# Patient Record
Sex: Female | Born: 1975
Health system: Southern US, Community
[De-identification: ages and names within clinical notes are randomized; demographics above are authoritative.]

## PROBLEM LIST (undated history)

## (undated) DIAGNOSIS — K909 Intestinal malabsorption, unspecified: Secondary | ICD-10-CM

## (undated) DIAGNOSIS — F419 Anxiety disorder, unspecified: Secondary | ICD-10-CM

## (undated) DIAGNOSIS — R251 Tremor, unspecified: Secondary | ICD-10-CM

## (undated) DIAGNOSIS — R61 Generalized hyperhidrosis: Secondary | ICD-10-CM

## (undated) DIAGNOSIS — F32A Depression, unspecified: Secondary | ICD-10-CM

## (undated) DIAGNOSIS — G43909 Migraine, unspecified, not intractable, without status migrainosus: Secondary | ICD-10-CM

## (undated) HISTORY — DX: Migraine, unspecified, not intractable, without status migrainosus: G43.909

## (undated) HISTORY — PX: CHOLECYSTECTOMY: SHX55

## (undated) HISTORY — DX: Anxiety disorder, unspecified: F41.9

## (undated) HISTORY — DX: Intestinal malabsorption, unspecified: K90.9

## (undated) HISTORY — DX: Generalized hyperhidrosis: R61

## (undated) HISTORY — DX: Depression, unspecified: F32.A

## (undated) HISTORY — DX: Tremor, unspecified: R25.1

## (undated) HISTORY — PX: LEEP: SHX91

---

## 2002-05-15 DIAGNOSIS — K9089 Other intestinal malabsorption: Secondary | ICD-10-CM | POA: Insufficient documentation

## 2016-07-05 DIAGNOSIS — M9905 Segmental and somatic dysfunction of pelvic region: Secondary | ICD-10-CM | POA: Diagnosis not present

## 2016-07-05 DIAGNOSIS — R51 Headache: Secondary | ICD-10-CM | POA: Diagnosis not present

## 2016-07-05 DIAGNOSIS — M9903 Segmental and somatic dysfunction of lumbar region: Secondary | ICD-10-CM | POA: Diagnosis not present

## 2016-07-05 DIAGNOSIS — M9901 Segmental and somatic dysfunction of cervical region: Secondary | ICD-10-CM | POA: Diagnosis not present

## 2016-07-11 DIAGNOSIS — M9901 Segmental and somatic dysfunction of cervical region: Secondary | ICD-10-CM | POA: Diagnosis not present

## 2016-07-11 DIAGNOSIS — R51 Headache: Secondary | ICD-10-CM | POA: Diagnosis not present

## 2016-07-11 DIAGNOSIS — M9905 Segmental and somatic dysfunction of pelvic region: Secondary | ICD-10-CM | POA: Diagnosis not present

## 2016-07-11 DIAGNOSIS — M9903 Segmental and somatic dysfunction of lumbar region: Secondary | ICD-10-CM | POA: Diagnosis not present

## 2016-07-18 DIAGNOSIS — M9901 Segmental and somatic dysfunction of cervical region: Secondary | ICD-10-CM | POA: Diagnosis not present

## 2016-07-18 DIAGNOSIS — M9905 Segmental and somatic dysfunction of pelvic region: Secondary | ICD-10-CM | POA: Diagnosis not present

## 2016-07-18 DIAGNOSIS — R51 Headache: Secondary | ICD-10-CM | POA: Diagnosis not present

## 2016-07-18 DIAGNOSIS — M9903 Segmental and somatic dysfunction of lumbar region: Secondary | ICD-10-CM | POA: Diagnosis not present

## 2016-08-01 DIAGNOSIS — R51 Headache: Secondary | ICD-10-CM | POA: Diagnosis not present

## 2016-08-01 DIAGNOSIS — M9905 Segmental and somatic dysfunction of pelvic region: Secondary | ICD-10-CM | POA: Diagnosis not present

## 2016-08-01 DIAGNOSIS — M9901 Segmental and somatic dysfunction of cervical region: Secondary | ICD-10-CM | POA: Diagnosis not present

## 2016-08-01 DIAGNOSIS — M9903 Segmental and somatic dysfunction of lumbar region: Secondary | ICD-10-CM | POA: Diagnosis not present

## 2016-08-18 DIAGNOSIS — M9901 Segmental and somatic dysfunction of cervical region: Secondary | ICD-10-CM | POA: Diagnosis not present

## 2016-08-18 DIAGNOSIS — M9903 Segmental and somatic dysfunction of lumbar region: Secondary | ICD-10-CM | POA: Diagnosis not present

## 2016-08-18 DIAGNOSIS — R51 Headache: Secondary | ICD-10-CM | POA: Diagnosis not present

## 2016-08-18 DIAGNOSIS — M9905 Segmental and somatic dysfunction of pelvic region: Secondary | ICD-10-CM | POA: Diagnosis not present

## 2016-08-23 DIAGNOSIS — K9089 Other intestinal malabsorption: Secondary | ICD-10-CM | POA: Diagnosis not present

## 2016-08-23 DIAGNOSIS — R109 Unspecified abdominal pain: Secondary | ICD-10-CM | POA: Diagnosis not present

## 2016-09-01 DIAGNOSIS — M9901 Segmental and somatic dysfunction of cervical region: Secondary | ICD-10-CM | POA: Diagnosis not present

## 2016-09-01 DIAGNOSIS — M9905 Segmental and somatic dysfunction of pelvic region: Secondary | ICD-10-CM | POA: Diagnosis not present

## 2016-09-01 DIAGNOSIS — R51 Headache: Secondary | ICD-10-CM | POA: Diagnosis not present

## 2016-09-01 DIAGNOSIS — M9903 Segmental and somatic dysfunction of lumbar region: Secondary | ICD-10-CM | POA: Diagnosis not present

## 2016-09-22 DIAGNOSIS — K9049 Malabsorption due to intolerance, not elsewhere classified: Secondary | ICD-10-CM | POA: Diagnosis not present

## 2016-09-29 DIAGNOSIS — K9049 Malabsorption due to intolerance, not elsewhere classified: Secondary | ICD-10-CM | POA: Diagnosis not present

## 2016-10-02 DIAGNOSIS — K9049 Malabsorption due to intolerance, not elsewhere classified: Secondary | ICD-10-CM | POA: Diagnosis not present

## 2016-10-04 DIAGNOSIS — M9903 Segmental and somatic dysfunction of lumbar region: Secondary | ICD-10-CM | POA: Diagnosis not present

## 2016-10-04 DIAGNOSIS — M9901 Segmental and somatic dysfunction of cervical region: Secondary | ICD-10-CM | POA: Diagnosis not present

## 2016-10-04 DIAGNOSIS — M9905 Segmental and somatic dysfunction of pelvic region: Secondary | ICD-10-CM | POA: Diagnosis not present

## 2016-10-04 DIAGNOSIS — R51 Headache: Secondary | ICD-10-CM | POA: Diagnosis not present

## 2016-10-13 DIAGNOSIS — K580 Irritable bowel syndrome with diarrhea: Secondary | ICD-10-CM | POA: Diagnosis not present

## 2016-11-27 DIAGNOSIS — M9903 Segmental and somatic dysfunction of lumbar region: Secondary | ICD-10-CM | POA: Diagnosis not present

## 2016-11-27 DIAGNOSIS — M9905 Segmental and somatic dysfunction of pelvic region: Secondary | ICD-10-CM | POA: Diagnosis not present

## 2016-11-27 DIAGNOSIS — R51 Headache: Secondary | ICD-10-CM | POA: Diagnosis not present

## 2016-11-27 DIAGNOSIS — M9901 Segmental and somatic dysfunction of cervical region: Secondary | ICD-10-CM | POA: Diagnosis not present

## 2017-01-10 DIAGNOSIS — M9905 Segmental and somatic dysfunction of pelvic region: Secondary | ICD-10-CM | POA: Diagnosis not present

## 2017-01-10 DIAGNOSIS — M9903 Segmental and somatic dysfunction of lumbar region: Secondary | ICD-10-CM | POA: Diagnosis not present

## 2017-01-10 DIAGNOSIS — M9901 Segmental and somatic dysfunction of cervical region: Secondary | ICD-10-CM | POA: Diagnosis not present

## 2017-01-10 DIAGNOSIS — R51 Headache: Secondary | ICD-10-CM | POA: Diagnosis not present

## 2017-02-01 DIAGNOSIS — M9901 Segmental and somatic dysfunction of cervical region: Secondary | ICD-10-CM | POA: Diagnosis not present

## 2017-02-01 DIAGNOSIS — M9903 Segmental and somatic dysfunction of lumbar region: Secondary | ICD-10-CM | POA: Diagnosis not present

## 2017-02-01 DIAGNOSIS — M9905 Segmental and somatic dysfunction of pelvic region: Secondary | ICD-10-CM | POA: Diagnosis not present

## 2017-02-01 DIAGNOSIS — R51 Headache: Secondary | ICD-10-CM | POA: Diagnosis not present

## 2017-02-28 DIAGNOSIS — M9903 Segmental and somatic dysfunction of lumbar region: Secondary | ICD-10-CM | POA: Diagnosis not present

## 2017-02-28 DIAGNOSIS — R51 Headache: Secondary | ICD-10-CM | POA: Diagnosis not present

## 2017-02-28 DIAGNOSIS — M9905 Segmental and somatic dysfunction of pelvic region: Secondary | ICD-10-CM | POA: Diagnosis not present

## 2017-02-28 DIAGNOSIS — M9901 Segmental and somatic dysfunction of cervical region: Secondary | ICD-10-CM | POA: Diagnosis not present

## 2017-03-28 DIAGNOSIS — R51 Headache: Secondary | ICD-10-CM | POA: Diagnosis not present

## 2017-03-28 DIAGNOSIS — M9905 Segmental and somatic dysfunction of pelvic region: Secondary | ICD-10-CM | POA: Diagnosis not present

## 2017-03-28 DIAGNOSIS — M9903 Segmental and somatic dysfunction of lumbar region: Secondary | ICD-10-CM | POA: Diagnosis not present

## 2017-03-28 DIAGNOSIS — M9901 Segmental and somatic dysfunction of cervical region: Secondary | ICD-10-CM | POA: Diagnosis not present

## 2017-04-25 DIAGNOSIS — M9905 Segmental and somatic dysfunction of pelvic region: Secondary | ICD-10-CM | POA: Diagnosis not present

## 2017-04-25 DIAGNOSIS — M9903 Segmental and somatic dysfunction of lumbar region: Secondary | ICD-10-CM | POA: Diagnosis not present

## 2017-04-25 DIAGNOSIS — M9901 Segmental and somatic dysfunction of cervical region: Secondary | ICD-10-CM | POA: Diagnosis not present

## 2017-04-25 DIAGNOSIS — R51 Headache: Secondary | ICD-10-CM | POA: Diagnosis not present

## 2017-04-25 DIAGNOSIS — L74519 Primary focal hyperhidrosis, unspecified: Secondary | ICD-10-CM | POA: Diagnosis not present

## 2017-04-25 DIAGNOSIS — K9089 Other intestinal malabsorption: Secondary | ICD-10-CM | POA: Diagnosis not present

## 2017-05-22 DIAGNOSIS — K580 Irritable bowel syndrome with diarrhea: Secondary | ICD-10-CM | POA: Diagnosis not present

## 2017-05-23 DIAGNOSIS — R51 Headache: Secondary | ICD-10-CM | POA: Diagnosis not present

## 2017-05-23 DIAGNOSIS — M9903 Segmental and somatic dysfunction of lumbar region: Secondary | ICD-10-CM | POA: Diagnosis not present

## 2017-05-23 DIAGNOSIS — M9901 Segmental and somatic dysfunction of cervical region: Secondary | ICD-10-CM | POA: Diagnosis not present

## 2017-05-23 DIAGNOSIS — M9905 Segmental and somatic dysfunction of pelvic region: Secondary | ICD-10-CM | POA: Diagnosis not present

## 2017-06-20 DIAGNOSIS — M9905 Segmental and somatic dysfunction of pelvic region: Secondary | ICD-10-CM | POA: Diagnosis not present

## 2017-06-20 DIAGNOSIS — M9903 Segmental and somatic dysfunction of lumbar region: Secondary | ICD-10-CM | POA: Diagnosis not present

## 2017-06-20 DIAGNOSIS — R51 Headache: Secondary | ICD-10-CM | POA: Diagnosis not present

## 2017-06-20 DIAGNOSIS — M9901 Segmental and somatic dysfunction of cervical region: Secondary | ICD-10-CM | POA: Diagnosis not present

## 2017-07-18 DIAGNOSIS — M9905 Segmental and somatic dysfunction of pelvic region: Secondary | ICD-10-CM | POA: Diagnosis not present

## 2017-07-18 DIAGNOSIS — R51 Headache: Secondary | ICD-10-CM | POA: Diagnosis not present

## 2017-07-18 DIAGNOSIS — M9901 Segmental and somatic dysfunction of cervical region: Secondary | ICD-10-CM | POA: Diagnosis not present

## 2017-07-18 DIAGNOSIS — M9903 Segmental and somatic dysfunction of lumbar region: Secondary | ICD-10-CM | POA: Diagnosis not present

## 2017-08-15 DIAGNOSIS — M9901 Segmental and somatic dysfunction of cervical region: Secondary | ICD-10-CM | POA: Diagnosis not present

## 2017-08-15 DIAGNOSIS — R51 Headache: Secondary | ICD-10-CM | POA: Diagnosis not present

## 2017-08-15 DIAGNOSIS — M9905 Segmental and somatic dysfunction of pelvic region: Secondary | ICD-10-CM | POA: Diagnosis not present

## 2017-08-15 DIAGNOSIS — M9903 Segmental and somatic dysfunction of lumbar region: Secondary | ICD-10-CM | POA: Diagnosis not present

## 2017-09-11 DIAGNOSIS — K580 Irritable bowel syndrome with diarrhea: Secondary | ICD-10-CM | POA: Diagnosis not present

## 2017-09-12 DIAGNOSIS — M9905 Segmental and somatic dysfunction of pelvic region: Secondary | ICD-10-CM | POA: Diagnosis not present

## 2017-09-12 DIAGNOSIS — M9903 Segmental and somatic dysfunction of lumbar region: Secondary | ICD-10-CM | POA: Diagnosis not present

## 2017-09-12 DIAGNOSIS — R51 Headache: Secondary | ICD-10-CM | POA: Diagnosis not present

## 2017-09-12 DIAGNOSIS — M9901 Segmental and somatic dysfunction of cervical region: Secondary | ICD-10-CM | POA: Diagnosis not present

## 2017-11-07 DIAGNOSIS — M9905 Segmental and somatic dysfunction of pelvic region: Secondary | ICD-10-CM | POA: Diagnosis not present

## 2017-11-07 DIAGNOSIS — M9901 Segmental and somatic dysfunction of cervical region: Secondary | ICD-10-CM | POA: Diagnosis not present

## 2017-11-07 DIAGNOSIS — M9903 Segmental and somatic dysfunction of lumbar region: Secondary | ICD-10-CM | POA: Diagnosis not present

## 2017-11-07 DIAGNOSIS — R51 Headache: Secondary | ICD-10-CM | POA: Diagnosis not present

## 2017-12-05 DIAGNOSIS — M9903 Segmental and somatic dysfunction of lumbar region: Secondary | ICD-10-CM | POA: Diagnosis not present

## 2017-12-05 DIAGNOSIS — M9901 Segmental and somatic dysfunction of cervical region: Secondary | ICD-10-CM | POA: Diagnosis not present

## 2017-12-05 DIAGNOSIS — M9905 Segmental and somatic dysfunction of pelvic region: Secondary | ICD-10-CM | POA: Diagnosis not present

## 2017-12-05 DIAGNOSIS — R51 Headache: Secondary | ICD-10-CM | POA: Diagnosis not present

## 2018-01-02 DIAGNOSIS — M9902 Segmental and somatic dysfunction of thoracic region: Secondary | ICD-10-CM | POA: Diagnosis not present

## 2018-01-02 DIAGNOSIS — M9901 Segmental and somatic dysfunction of cervical region: Secondary | ICD-10-CM | POA: Diagnosis not present

## 2018-01-02 DIAGNOSIS — M9905 Segmental and somatic dysfunction of pelvic region: Secondary | ICD-10-CM | POA: Diagnosis not present

## 2018-01-02 DIAGNOSIS — M9903 Segmental and somatic dysfunction of lumbar region: Secondary | ICD-10-CM | POA: Diagnosis not present

## 2018-01-30 DIAGNOSIS — M9902 Segmental and somatic dysfunction of thoracic region: Secondary | ICD-10-CM | POA: Diagnosis not present

## 2018-01-30 DIAGNOSIS — M9901 Segmental and somatic dysfunction of cervical region: Secondary | ICD-10-CM | POA: Diagnosis not present

## 2018-01-30 DIAGNOSIS — M9905 Segmental and somatic dysfunction of pelvic region: Secondary | ICD-10-CM | POA: Diagnosis not present

## 2018-01-30 DIAGNOSIS — M9903 Segmental and somatic dysfunction of lumbar region: Secondary | ICD-10-CM | POA: Diagnosis not present

## 2018-02-28 DIAGNOSIS — M9903 Segmental and somatic dysfunction of lumbar region: Secondary | ICD-10-CM | POA: Diagnosis not present

## 2018-02-28 DIAGNOSIS — M9901 Segmental and somatic dysfunction of cervical region: Secondary | ICD-10-CM | POA: Diagnosis not present

## 2018-02-28 DIAGNOSIS — M9902 Segmental and somatic dysfunction of thoracic region: Secondary | ICD-10-CM | POA: Diagnosis not present

## 2018-02-28 DIAGNOSIS — M9905 Segmental and somatic dysfunction of pelvic region: Secondary | ICD-10-CM | POA: Diagnosis not present

## 2018-04-15 DIAGNOSIS — M9903 Segmental and somatic dysfunction of lumbar region: Secondary | ICD-10-CM | POA: Diagnosis not present

## 2018-04-15 DIAGNOSIS — M9902 Segmental and somatic dysfunction of thoracic region: Secondary | ICD-10-CM | POA: Diagnosis not present

## 2018-04-15 DIAGNOSIS — M9905 Segmental and somatic dysfunction of pelvic region: Secondary | ICD-10-CM | POA: Diagnosis not present

## 2018-04-15 DIAGNOSIS — M9901 Segmental and somatic dysfunction of cervical region: Secondary | ICD-10-CM | POA: Diagnosis not present

## 2018-05-03 DIAGNOSIS — L74519 Primary focal hyperhidrosis, unspecified: Secondary | ICD-10-CM | POA: Diagnosis not present

## 2018-05-03 DIAGNOSIS — K9089 Other intestinal malabsorption: Secondary | ICD-10-CM | POA: Diagnosis not present

## 2018-05-17 DIAGNOSIS — M9903 Segmental and somatic dysfunction of lumbar region: Secondary | ICD-10-CM | POA: Diagnosis not present

## 2018-05-17 DIAGNOSIS — M9901 Segmental and somatic dysfunction of cervical region: Secondary | ICD-10-CM | POA: Diagnosis not present

## 2018-05-17 DIAGNOSIS — M9905 Segmental and somatic dysfunction of pelvic region: Secondary | ICD-10-CM | POA: Diagnosis not present

## 2018-05-17 DIAGNOSIS — M9902 Segmental and somatic dysfunction of thoracic region: Secondary | ICD-10-CM | POA: Diagnosis not present

## 2018-06-21 DIAGNOSIS — M9905 Segmental and somatic dysfunction of pelvic region: Secondary | ICD-10-CM | POA: Diagnosis not present

## 2018-06-21 DIAGNOSIS — M9903 Segmental and somatic dysfunction of lumbar region: Secondary | ICD-10-CM | POA: Diagnosis not present

## 2018-06-21 DIAGNOSIS — M9901 Segmental and somatic dysfunction of cervical region: Secondary | ICD-10-CM | POA: Diagnosis not present

## 2018-06-21 DIAGNOSIS — M9902 Segmental and somatic dysfunction of thoracic region: Secondary | ICD-10-CM | POA: Diagnosis not present

## 2018-07-19 DIAGNOSIS — M9901 Segmental and somatic dysfunction of cervical region: Secondary | ICD-10-CM | POA: Diagnosis not present

## 2018-07-19 DIAGNOSIS — M9903 Segmental and somatic dysfunction of lumbar region: Secondary | ICD-10-CM | POA: Diagnosis not present

## 2018-07-19 DIAGNOSIS — M9902 Segmental and somatic dysfunction of thoracic region: Secondary | ICD-10-CM | POA: Diagnosis not present

## 2018-07-19 DIAGNOSIS — M9905 Segmental and somatic dysfunction of pelvic region: Secondary | ICD-10-CM | POA: Diagnosis not present

## 2018-10-31 DIAGNOSIS — M9905 Segmental and somatic dysfunction of pelvic region: Secondary | ICD-10-CM | POA: Diagnosis not present

## 2018-10-31 DIAGNOSIS — M9903 Segmental and somatic dysfunction of lumbar region: Secondary | ICD-10-CM | POA: Diagnosis not present

## 2018-10-31 DIAGNOSIS — M9902 Segmental and somatic dysfunction of thoracic region: Secondary | ICD-10-CM | POA: Diagnosis not present

## 2018-10-31 DIAGNOSIS — M9901 Segmental and somatic dysfunction of cervical region: Secondary | ICD-10-CM | POA: Diagnosis not present

## 2019-01-01 DIAGNOSIS — M9903 Segmental and somatic dysfunction of lumbar region: Secondary | ICD-10-CM | POA: Diagnosis not present

## 2019-01-01 DIAGNOSIS — M9905 Segmental and somatic dysfunction of pelvic region: Secondary | ICD-10-CM | POA: Diagnosis not present

## 2019-01-01 DIAGNOSIS — M9901 Segmental and somatic dysfunction of cervical region: Secondary | ICD-10-CM | POA: Diagnosis not present

## 2019-01-01 DIAGNOSIS — M9902 Segmental and somatic dysfunction of thoracic region: Secondary | ICD-10-CM | POA: Diagnosis not present

## 2019-01-08 DIAGNOSIS — M9901 Segmental and somatic dysfunction of cervical region: Secondary | ICD-10-CM | POA: Diagnosis not present

## 2019-01-08 DIAGNOSIS — M9902 Segmental and somatic dysfunction of thoracic region: Secondary | ICD-10-CM | POA: Diagnosis not present

## 2019-01-08 DIAGNOSIS — M9903 Segmental and somatic dysfunction of lumbar region: Secondary | ICD-10-CM | POA: Diagnosis not present

## 2019-01-08 DIAGNOSIS — M9905 Segmental and somatic dysfunction of pelvic region: Secondary | ICD-10-CM | POA: Diagnosis not present

## 2019-01-22 DIAGNOSIS — M9905 Segmental and somatic dysfunction of pelvic region: Secondary | ICD-10-CM | POA: Diagnosis not present

## 2019-01-22 DIAGNOSIS — M9901 Segmental and somatic dysfunction of cervical region: Secondary | ICD-10-CM | POA: Diagnosis not present

## 2019-01-22 DIAGNOSIS — M9902 Segmental and somatic dysfunction of thoracic region: Secondary | ICD-10-CM | POA: Diagnosis not present

## 2019-01-22 DIAGNOSIS — M9903 Segmental and somatic dysfunction of lumbar region: Secondary | ICD-10-CM | POA: Diagnosis not present

## 2019-02-06 DIAGNOSIS — M9902 Segmental and somatic dysfunction of thoracic region: Secondary | ICD-10-CM | POA: Diagnosis not present

## 2019-02-06 DIAGNOSIS — M9901 Segmental and somatic dysfunction of cervical region: Secondary | ICD-10-CM | POA: Diagnosis not present

## 2019-02-06 DIAGNOSIS — M9905 Segmental and somatic dysfunction of pelvic region: Secondary | ICD-10-CM | POA: Diagnosis not present

## 2019-02-06 DIAGNOSIS — M9903 Segmental and somatic dysfunction of lumbar region: Secondary | ICD-10-CM | POA: Diagnosis not present

## 2019-02-27 DIAGNOSIS — M9901 Segmental and somatic dysfunction of cervical region: Secondary | ICD-10-CM | POA: Diagnosis not present

## 2019-02-27 DIAGNOSIS — M9905 Segmental and somatic dysfunction of pelvic region: Secondary | ICD-10-CM | POA: Diagnosis not present

## 2019-02-27 DIAGNOSIS — M9902 Segmental and somatic dysfunction of thoracic region: Secondary | ICD-10-CM | POA: Diagnosis not present

## 2019-02-27 DIAGNOSIS — M9903 Segmental and somatic dysfunction of lumbar region: Secondary | ICD-10-CM | POA: Diagnosis not present

## 2019-03-26 DIAGNOSIS — M9902 Segmental and somatic dysfunction of thoracic region: Secondary | ICD-10-CM | POA: Diagnosis not present

## 2019-03-26 DIAGNOSIS — M9903 Segmental and somatic dysfunction of lumbar region: Secondary | ICD-10-CM | POA: Diagnosis not present

## 2019-03-26 DIAGNOSIS — M9905 Segmental and somatic dysfunction of pelvic region: Secondary | ICD-10-CM | POA: Diagnosis not present

## 2019-03-26 DIAGNOSIS — M9901 Segmental and somatic dysfunction of cervical region: Secondary | ICD-10-CM | POA: Diagnosis not present

## 2019-04-29 DIAGNOSIS — M9901 Segmental and somatic dysfunction of cervical region: Secondary | ICD-10-CM | POA: Diagnosis not present

## 2019-04-29 DIAGNOSIS — M9903 Segmental and somatic dysfunction of lumbar region: Secondary | ICD-10-CM | POA: Diagnosis not present

## 2019-04-29 DIAGNOSIS — M9902 Segmental and somatic dysfunction of thoracic region: Secondary | ICD-10-CM | POA: Diagnosis not present

## 2019-04-29 DIAGNOSIS — M9905 Segmental and somatic dysfunction of pelvic region: Secondary | ICD-10-CM | POA: Diagnosis not present

## 2019-05-02 DIAGNOSIS — L74519 Primary focal hyperhidrosis, unspecified: Secondary | ICD-10-CM | POA: Diagnosis not present

## 2019-05-02 DIAGNOSIS — K9089 Other intestinal malabsorption: Secondary | ICD-10-CM | POA: Diagnosis not present

## 2019-05-27 DIAGNOSIS — M9903 Segmental and somatic dysfunction of lumbar region: Secondary | ICD-10-CM | POA: Diagnosis not present

## 2019-05-27 DIAGNOSIS — M9902 Segmental and somatic dysfunction of thoracic region: Secondary | ICD-10-CM | POA: Diagnosis not present

## 2019-05-27 DIAGNOSIS — M9901 Segmental and somatic dysfunction of cervical region: Secondary | ICD-10-CM | POA: Diagnosis not present

## 2019-05-27 DIAGNOSIS — M9905 Segmental and somatic dysfunction of pelvic region: Secondary | ICD-10-CM | POA: Diagnosis not present

## 2019-06-24 DIAGNOSIS — Z1151 Encounter for screening for human papillomavirus (HPV): Secondary | ICD-10-CM | POA: Diagnosis not present

## 2019-06-24 DIAGNOSIS — Z6824 Body mass index (BMI) 24.0-24.9, adult: Secondary | ICD-10-CM | POA: Diagnosis not present

## 2019-06-24 DIAGNOSIS — M9905 Segmental and somatic dysfunction of pelvic region: Secondary | ICD-10-CM | POA: Diagnosis not present

## 2019-06-24 DIAGNOSIS — M9903 Segmental and somatic dysfunction of lumbar region: Secondary | ICD-10-CM | POA: Diagnosis not present

## 2019-06-24 DIAGNOSIS — Z1231 Encounter for screening mammogram for malignant neoplasm of breast: Secondary | ICD-10-CM | POA: Diagnosis not present

## 2019-06-24 DIAGNOSIS — M9902 Segmental and somatic dysfunction of thoracic region: Secondary | ICD-10-CM | POA: Diagnosis not present

## 2019-06-24 DIAGNOSIS — Z01419 Encounter for gynecological examination (general) (routine) without abnormal findings: Secondary | ICD-10-CM | POA: Diagnosis not present

## 2019-06-24 DIAGNOSIS — M9901 Segmental and somatic dysfunction of cervical region: Secondary | ICD-10-CM | POA: Diagnosis not present

## 2019-06-24 DIAGNOSIS — Z124 Encounter for screening for malignant neoplasm of cervix: Secondary | ICD-10-CM | POA: Diagnosis not present

## 2019-07-29 DIAGNOSIS — M9903 Segmental and somatic dysfunction of lumbar region: Secondary | ICD-10-CM | POA: Diagnosis not present

## 2019-07-29 DIAGNOSIS — M9901 Segmental and somatic dysfunction of cervical region: Secondary | ICD-10-CM | POA: Diagnosis not present

## 2019-07-29 DIAGNOSIS — M9902 Segmental and somatic dysfunction of thoracic region: Secondary | ICD-10-CM | POA: Diagnosis not present

## 2019-07-29 DIAGNOSIS — M9905 Segmental and somatic dysfunction of pelvic region: Secondary | ICD-10-CM | POA: Diagnosis not present

## 2019-08-13 DIAGNOSIS — N939 Abnormal uterine and vaginal bleeding, unspecified: Secondary | ICD-10-CM | POA: Diagnosis not present

## 2019-08-26 DIAGNOSIS — M9905 Segmental and somatic dysfunction of pelvic region: Secondary | ICD-10-CM | POA: Diagnosis not present

## 2019-08-26 DIAGNOSIS — M9903 Segmental and somatic dysfunction of lumbar region: Secondary | ICD-10-CM | POA: Diagnosis not present

## 2019-08-26 DIAGNOSIS — M9901 Segmental and somatic dysfunction of cervical region: Secondary | ICD-10-CM | POA: Diagnosis not present

## 2019-08-26 DIAGNOSIS — M9902 Segmental and somatic dysfunction of thoracic region: Secondary | ICD-10-CM | POA: Diagnosis not present

## 2019-09-23 DIAGNOSIS — M9903 Segmental and somatic dysfunction of lumbar region: Secondary | ICD-10-CM | POA: Diagnosis not present

## 2019-09-23 DIAGNOSIS — M9905 Segmental and somatic dysfunction of pelvic region: Secondary | ICD-10-CM | POA: Diagnosis not present

## 2019-09-23 DIAGNOSIS — M9901 Segmental and somatic dysfunction of cervical region: Secondary | ICD-10-CM | POA: Diagnosis not present

## 2019-09-23 DIAGNOSIS — M9902 Segmental and somatic dysfunction of thoracic region: Secondary | ICD-10-CM | POA: Diagnosis not present

## 2019-11-24 DIAGNOSIS — M9905 Segmental and somatic dysfunction of pelvic region: Secondary | ICD-10-CM | POA: Diagnosis not present

## 2019-11-24 DIAGNOSIS — M9901 Segmental and somatic dysfunction of cervical region: Secondary | ICD-10-CM | POA: Diagnosis not present

## 2019-11-24 DIAGNOSIS — M9903 Segmental and somatic dysfunction of lumbar region: Secondary | ICD-10-CM | POA: Diagnosis not present

## 2019-11-24 DIAGNOSIS — M9902 Segmental and somatic dysfunction of thoracic region: Secondary | ICD-10-CM | POA: Diagnosis not present

## 2019-12-30 DIAGNOSIS — M9903 Segmental and somatic dysfunction of lumbar region: Secondary | ICD-10-CM | POA: Diagnosis not present

## 2019-12-30 DIAGNOSIS — M9905 Segmental and somatic dysfunction of pelvic region: Secondary | ICD-10-CM | POA: Diagnosis not present

## 2019-12-30 DIAGNOSIS — M9902 Segmental and somatic dysfunction of thoracic region: Secondary | ICD-10-CM | POA: Diagnosis not present

## 2019-12-30 DIAGNOSIS — M9901 Segmental and somatic dysfunction of cervical region: Secondary | ICD-10-CM | POA: Diagnosis not present

## 2020-01-27 DIAGNOSIS — M9903 Segmental and somatic dysfunction of lumbar region: Secondary | ICD-10-CM | POA: Diagnosis not present

## 2020-01-27 DIAGNOSIS — M9902 Segmental and somatic dysfunction of thoracic region: Secondary | ICD-10-CM | POA: Diagnosis not present

## 2020-01-27 DIAGNOSIS — M9901 Segmental and somatic dysfunction of cervical region: Secondary | ICD-10-CM | POA: Diagnosis not present

## 2020-01-27 DIAGNOSIS — M9905 Segmental and somatic dysfunction of pelvic region: Secondary | ICD-10-CM | POA: Diagnosis not present

## 2020-03-24 DIAGNOSIS — M9903 Segmental and somatic dysfunction of lumbar region: Secondary | ICD-10-CM | POA: Diagnosis not present

## 2020-03-24 DIAGNOSIS — M9902 Segmental and somatic dysfunction of thoracic region: Secondary | ICD-10-CM | POA: Diagnosis not present

## 2020-03-24 DIAGNOSIS — M9901 Segmental and somatic dysfunction of cervical region: Secondary | ICD-10-CM | POA: Diagnosis not present

## 2020-03-24 DIAGNOSIS — M9905 Segmental and somatic dysfunction of pelvic region: Secondary | ICD-10-CM | POA: Diagnosis not present

## 2020-04-06 DIAGNOSIS — M9902 Segmental and somatic dysfunction of thoracic region: Secondary | ICD-10-CM | POA: Diagnosis not present

## 2020-04-06 DIAGNOSIS — M9903 Segmental and somatic dysfunction of lumbar region: Secondary | ICD-10-CM | POA: Diagnosis not present

## 2020-04-06 DIAGNOSIS — M9901 Segmental and somatic dysfunction of cervical region: Secondary | ICD-10-CM | POA: Diagnosis not present

## 2020-04-06 DIAGNOSIS — M9905 Segmental and somatic dysfunction of pelvic region: Secondary | ICD-10-CM | POA: Diagnosis not present

## 2020-04-16 DIAGNOSIS — M9903 Segmental and somatic dysfunction of lumbar region: Secondary | ICD-10-CM | POA: Diagnosis not present

## 2020-04-16 DIAGNOSIS — M9902 Segmental and somatic dysfunction of thoracic region: Secondary | ICD-10-CM | POA: Diagnosis not present

## 2020-04-16 DIAGNOSIS — M9901 Segmental and somatic dysfunction of cervical region: Secondary | ICD-10-CM | POA: Diagnosis not present

## 2020-04-16 DIAGNOSIS — M9905 Segmental and somatic dysfunction of pelvic region: Secondary | ICD-10-CM | POA: Diagnosis not present

## 2020-05-04 DIAGNOSIS — M9903 Segmental and somatic dysfunction of lumbar region: Secondary | ICD-10-CM | POA: Diagnosis not present

## 2020-05-04 DIAGNOSIS — M9902 Segmental and somatic dysfunction of thoracic region: Secondary | ICD-10-CM | POA: Diagnosis not present

## 2020-05-04 DIAGNOSIS — M9901 Segmental and somatic dysfunction of cervical region: Secondary | ICD-10-CM | POA: Diagnosis not present

## 2020-05-04 DIAGNOSIS — M9905 Segmental and somatic dysfunction of pelvic region: Secondary | ICD-10-CM | POA: Diagnosis not present

## 2020-05-05 DIAGNOSIS — R251 Tremor, unspecified: Secondary | ICD-10-CM | POA: Diagnosis not present

## 2020-05-05 DIAGNOSIS — K9089 Other intestinal malabsorption: Secondary | ICD-10-CM | POA: Diagnosis not present

## 2020-05-05 DIAGNOSIS — L74519 Primary focal hyperhidrosis, unspecified: Secondary | ICD-10-CM | POA: Diagnosis not present

## 2020-05-10 ENCOUNTER — Encounter: Payer: Self-pay | Admitting: Neurology

## 2020-05-13 NOTE — Progress Notes (Signed)
Assessment/Plan:   1.  Idiopathic Parkinson's disease.  The patient has tremor, bradykinesia, rigidity  -We discussed the diagnosis as well as pathophysiology of the disease.  We discussed treatment options as well as prognostic indicators.  Patient education was provided.  -Greater than 50% of the visit was spent in counseling answering questions and talking about what to expect now as well as in the future.  We talked about medication options as well as potential future surgical options.  We talked about safety in the home.  -We discussed medications, but patient was pretty overwhelmed with the diagnosis, and ultimately decided to bring her back in the next 6 to 8 weeks to further discuss.  -I will refer the patient to counseling   -We discussed community resources in the area including patient support groups and community exercise programs for PD and pt education was provided to the patient.  -Given age, we will go ahead and do an MRI of the brain.  -I would like to do some lab work.  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  Given that she was very overwhelmed with the diagnosis, we decided that today was not the day to put more stress on her.  We will address this in the future.  -Patient exercises a lot (currently training for marathon) and we talked about the value of exercise and Parkinson's disease, and that it may even slow the disease.     Subjective:   Elizabeth Woodard was seen today in the movement disorders clinic for neurologic consultation at the request of Farris Has, MD.  The consultation is for the evaluation of R arm/leg tremor.  Outside records that were made available to me were reviewed.  Tremor: Yes.     How long has it been going on? Increased in late September but she remembers that she always had some R leg tremor when sitting on the toilet.  She also knows that when laying on the chiropractic table she would have  tremor in the R hand.  Starting late sept, she noted R tremor in the R arm/R leg  At rest or with activation?  Rest but if holding something light (cell phone);   When is it noted the most?  Sitting; will note it if driving or reading a magazine.  Most of the time she can look at it and stop it for a bit  Fam hx of tremor?  No.  Affected by caffeine:  No. (2 cups coffee)  Affected by alcohol:  Not sure if it changes it (3 drinks/week)  Affected by stress:  Yes.    Affected by fatigue:  Yes.  , may decrease it, as may be more "calm in the evening"  Tremor inducing meds:  No.  Other Specific Symptoms:  Voice: no change Sleep: sleeps well  Vivid Dreams:  No.  Acting out dreams:  No. Wet Pillows: Yes.   Postural symptoms:  No.  Falls?  No. Bradykinesia symptoms: no bradykinesia noted Loss of smell:  No. Loss of taste:  No. Urinary Incontinence:  No. Difficulty Swallowing:  No. Handwriting, micrographia: No. Trouble with ADL's:  No.  Trouble buttoning clothing: No. Depression:  No. Memory changes:  No.  Hallucinations:  No.  visual distortions: No. N/V:  No. Lightheaded:  No.  Syncope: No. Diplopia:  No. Dyskinesia:  No.  Neuroimaging of the brain has not previously been performed.   ALLERGIES:  No  Known Allergies  CURRENT MEDICATIONS:  Current Outpatient Medications  Medication Instructions  . amitriptyline (ELAVIL) 20 mg, Oral, Daily at bedtime  . cholestyramine (QUESTRAN) 4 g, Oral, 2 times daily  . diazepam (VALIUM) 5 mg, Oral, Every 6 hours PRN, Take 1-2 tablets as needed for Anxiety  . Lidocaine (HM LIDOCAINE PATCH) 4 % PTCH Apply externally, As needed when having blood work    Objective:   PHYSICAL EXAMINATION:    VITALS:   Vitals:   05/18/20 0843  BP: 110/78  Pulse: 73  SpO2: 98%  Weight: 123 lb (55.8 kg)  Height: 4\' 11"  (1.499 m)    GEN:  The patient appears stated age and is in NAD. HEENT:  Normocephalic, atraumatic.  The mucous membranes are  moist. The superficial temporal arteries are without ropiness or tenderness. CV:  RRR Lungs:  CTAB Neck/HEME:  There are no carotid bruits bilaterally.  Neurological examination:  Orientation: The patient is alert and oriented x3.  Cranial nerves: There is good facial symmetry.  Extraocular muscles are intact. The visual fields are full to confrontational testing. The speech is fluent and clear. Soft palate rises symmetrically and there is no tongue deviation. Hearing is intact to conversational tone. Sensation: Sensation is intact to light touch throughout (facial, trunk, extremities). Vibration is intact at the bilateral big toe. There is no extinction with double simultaneous stimulation.  Motor: Strength is 5/5 in the bilateral upper and lower extremities.   Shoulder shrug is equal and symmetric.  There is no pronator drift. Deep tendon reflexes: Deep tendon reflexes are 2+/4 at the bilateral biceps, triceps, brachioradialis, patella and achilles. Plantar responses are downgoing bilaterally.  Movement examination: Tone: There is mild increased tone in the RUE.  Tone elsewhere is nl.   Abnormal movements: there is RUE/RLE tremor that increases with distraction Coordination:  There is no decremation with RAM's, with any form of RAMS, including alternating supination and pronation of the forearm, hand opening and closing, finger taps, heel taps and toe taps. Gait and Station: The patient has no difficulty arising out of a deep-seated chair without the use of the hands. The patient's stride length is good with decreased arm swing on the R.  The patient has a neg pull test.     Labs: None sent for review   Total time spent on today's visit was 80 minutes, including both face-to-face time and nonface-to-face time.  Time included that spent on review of records (prior notes available to me/labs/imaging if pertinent), discussing treatment and goals, answering patient's questions and coordinating  care.  Cc:  , MD

## 2020-05-17 DIAGNOSIS — M9902 Segmental and somatic dysfunction of thoracic region: Secondary | ICD-10-CM | POA: Diagnosis not present

## 2020-05-17 DIAGNOSIS — M9903 Segmental and somatic dysfunction of lumbar region: Secondary | ICD-10-CM | POA: Diagnosis not present

## 2020-05-17 DIAGNOSIS — M9905 Segmental and somatic dysfunction of pelvic region: Secondary | ICD-10-CM | POA: Diagnosis not present

## 2020-05-17 DIAGNOSIS — M9901 Segmental and somatic dysfunction of cervical region: Secondary | ICD-10-CM | POA: Diagnosis not present

## 2020-05-18 ENCOUNTER — Telehealth: Payer: Self-pay | Admitting: Neurology

## 2020-05-18 ENCOUNTER — Ambulatory Visit (INDEPENDENT_AMBULATORY_CARE_PROVIDER_SITE_OTHER): Payer: BC Managed Care – PPO | Admitting: Neurology

## 2020-05-18 ENCOUNTER — Other Ambulatory Visit: Payer: Self-pay

## 2020-05-18 ENCOUNTER — Encounter: Payer: Self-pay | Admitting: Neurology

## 2020-05-18 VITALS — BP 110/78 | HR 73 | Ht 59.0 in | Wt 123.0 lb

## 2020-05-18 DIAGNOSIS — G20 Parkinson's disease: Secondary | ICD-10-CM

## 2020-05-18 DIAGNOSIS — R531 Weakness: Secondary | ICD-10-CM

## 2020-05-18 NOTE — Patient Instructions (Signed)
It was good to see you today!  We will get you a referral to Dannell Raczkowski, our prior Parkinsons disease social worker, who now does chronic disease counseling.  She will be able to help you with resources in the community and with adjustment to this diagnosis.  A referral to [facility] has been placed for your MRI someone will contact you directly to schedule your appt. They are located at [address] [facility]. Please contact them directly by calling [phone] with any questions regarding your referral.   Parkinsons [facility]   . Online Resources for Power over Parkinson's Group . Local [place] Online Groups  o Power over [employer] Group :   - Power Over Parkinson's Patient Education Group will be Wednesday, December 8th at 2pm via Zoom.   - Upcoming Power over Parkinson's Meetings:  2nd Wednesdays of the month at 2 pm:       January 12th, February 9th - Amy Bernie Covey, PT at [facility] has resumed the lead of this group starting in July.  Contact Amy at [email or web address] if interested in participating in this online group o Parkinson's Care Partners Group:    3rd Mondays, Contact Lynwood Dawley o Atypical Parkinsonian Patient Group:   4th Wednesdays, Contact Lynwood Dawley o If you are interested in participating in these online groups with Maralyn Sago, please contact her directly for how to join those meetings.  Her contact information is [email or web address].  She will send you a link to join the [employer].  (Please note that Lynwood Dawley , MSW, LCSW, has resigned her position at [facility], but will continue to lead the online groups temporarily) .  Marland Kitchen Parkinson Foundation:  www.parkinson.org o PD Health at Home continues:  Mindfulness Mondays, Expert Briefing Tuesdays, Wellness Wednesdays, Take Time Thursdays, Fitness Fridays  o [employer]:  (Next one is February 2022, stay tuned) o Please check out their  website to sign up for emails and see their full online offerings .  Marland Kitchen [employer] Foundation:  www.[email or web address]  o Upcoming Webinar:   Stay tuned for 2022 o Check out additional information on their website to see their full online offerings .  Marland Kitchen [employer] Foundation:  www.[email or web address] o Upcoming Webinar:  Stay tuned for 2022 o Care Partner Monthly Meetup.  With Jillene Bucks [place].  First Tuesday of each month, 2 pm o Check out additional information to Live Well Today on their website .  Marland Kitchen Parkinson and Movement Disorders (PMD) Alliance:  www.pmdalliance.org o [employer] Online:  Online Education Events o Sign up for emails, which are sent weekly to give you updates on programming and online offerings .  Marland Kitchen Parkinson's Association of the [place]:  www.parkinsonassociation.org o Information on online support groups, online exercises including Yoga, Parkinson's exercises and more-LOTS of information on links to PD resources and online events o Virtual Support Group through Parkinson's Association of the [place]; next one is scheduled for Wednesday, April 14, 2020 at 2 pm. (These are typically scheduled for the 1st Wednesday of the month at 2 pm).  Visit website for details. .  . Additional links for movement activities: o PWR! Moves Classes at [facility] Exercise Room HAVE RESUMED, at a limited capacity.  We have several openings for Wednesday 10 am and 11 am classes.  Contact Amy Marriott, PT [email or web address] or [phone] if interested o Here is a link to the PWR!Moves classes on Zoom from [place] - Daily Mon-Sat at 10:00. [address],  FREE and open to all.  There is also a link below via Facebook if you use that  platform. - CopCameras.is - [email or web address]://[email or web address] o Parkinson's Wellness Recovery (PWR! Moves)  www.pwr4life.org - Info on the PWR! Virtual Experience:  You will have access to our expertise through self-assessment, guided plans that start with the PD-specific fundamentals, educational content, tips, Q&A with an expert, and a growing Engineering geologist of PD-specific pre-recorded and live exercise classes of varying types and intensity - both physical and cognitive! If that is not enough, we offer 1:1 wellness consultations (in-person or virtual) to personalize your PWR! Dance movement psychotherapist.  - Check out the PWR! Move of the month on the Parkinson Wellness Recovery website:  SearchPrisoners.de o [employer]  Fridays:  - As part of the PD Health @ Home program, this free video series focuses each week on one aspect of fitness designed to support people living with Parkinson's.  -  http://www.morris.[email or web address]/ o Dance for PD website is offering free, live-stream classes throughout the week, as well as links to [employer] of classes:  [email or web address]://danceforparkinsons.[email or web address]/ o Hotel manager for Parkinson's Class:  Dance Project of Ginette Otto is back this Fall!  Free offering for people with Parkinson's and care partners; virtual class this Fall. The class will be Wednesdays 4-5pm beginning 10/13.  Classes will run for 9 weeks 10/13-12/15,.  Register below: o [email or web address]?[email or web address]&SEASON=Parkinsons+Dance-[email or web address]  o For more information, contact [phone] or email Allena Napoleon at [email or web address] o Virtual dance and Pilates for Parkinson's classes: Click on the  Community Tab> Parkinson's Movement Initiative Tab.  To register for classes and for more information, visit www.[email or web address].co.za and click the "community" tab.  o [employer] Parkinson's Cycling Classes  - [employer]: 1pm on Fridays-Live classes at [facility] [employer] at [email or web address] or [phone]) [employer]: Virtual Classes Mondays and Thursdays (contact [place] at [email or web address] or [phone]) .  o [employer] - Three levels of classes are offered Tuesdays and Thursdays:  10:30 am,  12 noon & 1:45 pm at [employer]. To observe a class or for  more information, call [phone] or email [email or web address] . [employer]: o Financial trader Opportunities:  www.well-springsolutions.org/caregiver-education/caregiver-support-group.  You may also contact Loleta Chance at [email or web address] or [phone].   o Caregiver Virtual Event:   [facility] is having a [phone], Thursday, December 9th from 6-7 pm - Contact 11-16-1980 (above) for details o Well-Spring Navigator:  Just1Navigator program, a free service to help individuals and families through the journey of determining care for older adults.  The "Navigator" is a Loleta Chance, Child psychotherapist, who will speak with a prospective client and/or loved ones to provide an assessment of the situation and a set of recommendations for a personalized care plan -- all free of charge, and whether [facility] [employer] offers the needed service or not. If the need is not a service we provide, we are well-connected with reputable programs in town that we can refer you to.  www.well-springsolutions.org or to speak with the Navigator, call [phone].

## 2020-05-18 NOTE — Telephone Encounter (Signed)
Will you call patient and make sure she is doing okay following todays visit

## 2020-05-18 NOTE — Telephone Encounter (Signed)
Spoke with patient and she states she is doing fine.

## 2020-06-07 DIAGNOSIS — M9902 Segmental and somatic dysfunction of thoracic region: Secondary | ICD-10-CM | POA: Diagnosis not present

## 2020-06-07 DIAGNOSIS — M9903 Segmental and somatic dysfunction of lumbar region: Secondary | ICD-10-CM | POA: Diagnosis not present

## 2020-06-07 DIAGNOSIS — M9905 Segmental and somatic dysfunction of pelvic region: Secondary | ICD-10-CM | POA: Diagnosis not present

## 2020-06-07 DIAGNOSIS — M9901 Segmental and somatic dysfunction of cervical region: Secondary | ICD-10-CM | POA: Diagnosis not present

## 2020-06-08 ENCOUNTER — Other Ambulatory Visit: Payer: BC Managed Care – PPO

## 2020-06-18 ENCOUNTER — Other Ambulatory Visit: Payer: Self-pay

## 2020-06-18 DIAGNOSIS — R251 Tremor, unspecified: Secondary | ICD-10-CM

## 2020-07-07 NOTE — Progress Notes (Signed)
Assessment/Plan:   1.  Tremor  -As with last visit, I still feel that the patient likely has Parkinson's disease.  I do not think that she has FND.  We are going to go ahead and proceed with a DaTscan.  She already has that scheduled for Monday.  I will plan on following up with her after that.  We can decide where to go, including potentially referring her for a second opinion.  She is very needle phobic and requested a prescription for Valium just to get the DaTscan completed.  I did that, but did tell her that she would need a driver and she was agreeable to that.   Subjective:   Elizabeth Woodard was seen today in follow up for tremor and what was thought to be newly diagnosed Parkinson's disease last visit.  We did decide to do an MRI of the brain after last visit.  The patient ultimately decided to hold on that.  She emailed me and stated that she wondered if she had FND.  We decided to go ahead and proceed with a DaTscan.  She ultimately emailed me again and stated that she was feeling pretty good and wanted to see me first before doing that, although it is scheduled for Monday.  She also is contemplating second opinion.  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  She has read and tried to distract herself to see if tremor comes/goes.  Driving over here, she noted foot tremor but was nervous about the appt today.  States that she feels that she fits the "template" of FND.   ALLERGIES:  No Known Allergies  CURRENT MEDICATIONS:  Outpatient Encounter Medications as of 07/08/2020  Medication Sig  . amitriptyline (ELAVIL) 10 MG tablet Take 20 mg by mouth at bedtime.  . cholestyramine (QUESTRAN) 4 g packet Take 4 g by mouth 2 (two) times daily.  . Lidocaine 4 % PTCH Apply topically. As needed when having blood work  .  [DISCONTINUED] diazepam (VALIUM) 5 MG tablet Take 5 mg by mouth every 6 (six) hours as needed for anxiety. Take 1-2 tablets as needed for Anxiety  . diazepam (VALIUM) 5 MG tablet Take 1 tablet 1 hour prior to procedure; may repeat 15 min before   No facility-administered encounter medications on file as of 07/08/2020.    Objective:   PHYSICAL EXAMINATION:    VITALS:   Vitals:   07/08/20 1503  BP: 116/72  Pulse: 87  SpO2: 95%  Weight: 126 lb (57.2 kg)  Height: 4\' 11"  (1.499 m)    GEN:  The patient appears stated age and is in NAD. HEENT:  Normocephalic, atraumatic.  The mucous membranes are moist. The superficial temporal arteries are without ropiness or tenderness. CV:  RRR Lungs:  CTAB Neck/HEME:  There are no carotid bruits bilaterally.  Neurological examination:  Orientation: The patient is alert and oriented x3. Cranial nerves: There is good facial symmetry without facial hypomimia. The speech is fluent and clear. Soft palate rises symmetrically and there is no tongue deviation. Hearing is intact to conversational tone. Sensation: Sensation is intact to light touch throughout Motor: Strength is at least antigravity x4.  Movement examination: Tone: There is mild increased tone in the RUE, only with distraction procedures.  Tone elsewhere is nl.   Abnormal  movements: there is RUE tremor that increases with distraction Coordination:  There is no decremation with RAM's, with any form of RAMS, including alternating supination and pronation of the forearm, hand opening and closing, finger taps, heel taps and toe taps. Gait and Station: The patient has no difficulty arising out of a deep-seated chair without the use of the hands. The patient's stride length is good.  She is actually able to run up and down the hall without any trouble.   Total time spent on today's visit was 30 minutes, including both face-to-face time and nonface-to-face time.  Time included that spent on review  of records (prior notes available to me/labs/imaging if pertinent), discussing treatment and goals, answering patient's questions and coordinating care.  Cc:  Farris Has, MD

## 2020-07-08 ENCOUNTER — Other Ambulatory Visit: Payer: Self-pay

## 2020-07-08 ENCOUNTER — Ambulatory Visit (INDEPENDENT_AMBULATORY_CARE_PROVIDER_SITE_OTHER): Payer: BC Managed Care – PPO | Admitting: Neurology

## 2020-07-08 ENCOUNTER — Encounter: Payer: Self-pay | Admitting: Neurology

## 2020-07-08 VITALS — BP 116/72 | HR 87 | Ht 59.0 in | Wt 126.0 lb

## 2020-07-08 DIAGNOSIS — M9905 Segmental and somatic dysfunction of pelvic region: Secondary | ICD-10-CM | POA: Diagnosis not present

## 2020-07-08 DIAGNOSIS — R251 Tremor, unspecified: Secondary | ICD-10-CM | POA: Diagnosis not present

## 2020-07-08 DIAGNOSIS — M9903 Segmental and somatic dysfunction of lumbar region: Secondary | ICD-10-CM | POA: Diagnosis not present

## 2020-07-08 DIAGNOSIS — M9901 Segmental and somatic dysfunction of cervical region: Secondary | ICD-10-CM | POA: Diagnosis not present

## 2020-07-08 DIAGNOSIS — M9902 Segmental and somatic dysfunction of thoracic region: Secondary | ICD-10-CM | POA: Diagnosis not present

## 2020-07-08 MED ORDER — DIAZEPAM 5 MG PO TABS: ORAL_TABLET | ORAL | 0 refills | Status: DC

## 2020-07-13 ENCOUNTER — Encounter (HOSPITAL_COMMUNITY)
Admission: RE | Admit: 2020-07-13 | Discharge: 2020-07-13 | Disposition: A | Discharge status: Home / Self Care | Payer: BC Managed Care – PPO | Source: Ambulatory Visit | Attending: Neurology | Admitting: Neurology

## 2020-07-13 ENCOUNTER — Other Ambulatory Visit: Payer: Self-pay

## 2020-07-13 DIAGNOSIS — R251 Tremor, unspecified: Secondary | ICD-10-CM | POA: Insufficient documentation

## 2020-07-13 DIAGNOSIS — G20 Parkinson's disease: Secondary | ICD-10-CM | POA: Diagnosis not present

## 2020-07-13 MED ORDER — POTASSIUM IODIDE (ANTIDOTE) 130 MG PO TABS: 130.0000 mg | ORAL_TABLET | Freq: Once | ORAL | Status: DC

## 2020-07-13 MED ORDER — IOFLUPANE I 123 185 MBQ/2.5ML IV SOLN
4.8500 | Freq: Once | INTRAVENOUS | Status: AC | PRN
  Administered 2020-07-13: 4.85 via INTRAVENOUS
  Filled 2020-07-13: qty 5

## 2020-07-13 MED ORDER — POTASSIUM IODIDE (ANTIDOTE) 130 MG PO TABS
ORAL_TABLET | ORAL | Status: AC
  Filled 2020-07-13: qty 1

## 2020-07-14 ENCOUNTER — Telehealth: Payer: Self-pay

## 2020-07-14 NOTE — Telephone Encounter (Signed)
Received a call from [facility] in the Radiology department. She states she is calling because the patient recently had a NM DaTscan. She states she is calling about the impression. She wanted to make Dr Tat aware.

## 2020-07-14 NOTE — Telephone Encounter (Signed)
Patient notified and voiced understanding.

## 2020-07-14 NOTE — Progress Notes (Signed)
Virtual Visit Via Video   The purpose of this virtual visit is to provide medical care while limiting exposure to the novel coronavirus.    Consent was obtained for video visit:  Yes.   Answered questions that patient had about telehealth interaction:  Yes.   I discussed the limitations, risks, security and privacy concerns of performing an evaluation and management service by telemedicine. I also discussed with the patient that there may be a patient responsible charge related to this service. The patient expressed understanding and agreed to proceed.  Pt location: Home Physician Location: office Name of referring provider:  Farris Has, MD I connected with Elizabeth Woodard at patients initiation/request on 07/16/2020 at  1:00 PM EST by video enabled telemedicine application and verified that I am speaking with the correct person using two identifiers. Pt MRN:  [id number] Pt DOB:  Aug 08, 1975 Video Participants:  Elizabeth Woodard; Elizabeth Woodard accompanies the patient to the visit  Assessment/Plan:   1.  Parkinsons Disease, young onset  -DaT abnormal  -discussed data about using levodopa early on in disease.  She expresses interest in the PRISM trial and is not sure that she would like to start levodopa yet, but is also not quite sure she wants to enroll in the trial.  She is going to think about that and in the meantime I will see if [facility] is still enrolling patients in the trial.  -discussed 2nd opinion.  She wants to hold off on that for now.  Happy to accommodate if she changes her mind.  -Genetic testing may not be a bad idea, but could not accommodate that through video.  We may want to discuss that next visit.  -Discussed the importance of safe, cardiovascular exercise.  Discussed community resources.  Discussed the Thrive group.  2.  Adjustment d/o  -She really wants no medication.  Certainly understand the grief and depression associated with this new diagnosis.  She is willing  to go to counseling, but wants in person counseling.  This is definitely a struggle in our community right now.  I will try to reach out and see if we can find a resource for this.  We sent a referral 8 weeks ago for counseling for her as well.  Subjective   Patient seen today in follow-up to review her DaTscan.  Patient first saw me in January, 2022.  At that point in time, I diagnosed her with Parkinson's disease.  Patient ultimately questioned the diagnosis and felt that she had FND.  I saw her back on July 08, 2020 and her examination remained consistent with Parkinson's disease.  She subsequently had a DaTscan on March 1.  I personally reviewed this.  There was decreased activity in the left stratum compared to that of the right.  Current movement d/o meds:  None   Current Outpatient Medications on File Prior to Visit  Medication Sig Dispense Refill  . amitriptyline (ELAVIL) 10 MG tablet Take 20 mg by mouth at bedtime.    . cholestyramine (QUESTRAN) 4 g packet Take 4 g by mouth 2 (two) times daily.    . diazepam (VALIUM) 5 MG tablet Take 1 tablet 1 hour prior to procedure; may repeat 15 min before 2 tablet 0  . Lidocaine 4 % PTCH Apply topically. As needed when having blood work     Current Facility-Administered Medications on File Prior to Visit  Medication Dose Route Frequency Provider Last Rate Last Admin  . potassium iodide (IOSAT) tablet 130  mg  130 mg Oral Once Charlett Nose, MD         Objective   There were no vitals filed for this visit. GEN:  The patient appears stated age and is in NAD.  She is tearful at times.  Virtually 100% of the visit was spent in counseling with the patient.     Follow up Instructions      -I discussed the assessment and treatment plan with the patient. The patient was provided an opportunity to ask questions and all were answered. The patient agreed with the plan and demonstrated an understanding of the instructions.   The patient was  advised to call back or seek an in-person evaluation if the symptoms worsen or if the condition fails to improve as anticipated.    Total time spent on today's visit was , including both face-to-face time and nonface-to-face time.  Time included that spent on review of records (prior notes available to me/labs/imaging if pertinent), discussing treatment and goals, answering patient's questions and coordinating care.   Kerin Salen, DO

## 2020-07-16 ENCOUNTER — Other Ambulatory Visit: Payer: Self-pay

## 2020-07-16 ENCOUNTER — Telehealth (INDEPENDENT_AMBULATORY_CARE_PROVIDER_SITE_OTHER): Payer: BC Managed Care – PPO | Admitting: Neurology

## 2020-07-16 ENCOUNTER — Telehealth: Payer: Self-pay

## 2020-07-16 DIAGNOSIS — G20A1 Parkinson's disease without dyskinesia, without mention of fluctuations: Secondary | ICD-10-CM | POA: Insufficient documentation

## 2020-07-16 DIAGNOSIS — G20 Parkinson's disease: Secondary | ICD-10-CM | POA: Diagnosis not present

## 2020-07-16 MED ORDER — CARBIDOPA-LEVODOPA 25-100 MG PO TABS: 1.0000 | ORAL_TABLET | Freq: Three times a day (TID) | ORAL | 1 refills | Status: DC

## 2020-07-16 NOTE — Telephone Encounter (Signed)
Spoke with patient and made her aware that I attempted to call but I could not get through and advised her that I would be easier for her to fill out the forms on line per Dr Tat.   Patient states she filled out the forms online after she got off the phone with Dr Tat. She states the screening has to be done by March 31st. She states she sent a Clinical cytogeneticist message. I advised her that I would forward the message to Dr Tat. I asked patient to let us know if she wants to follow up with Dr Tat and she states she does not know what to do.

## 2020-07-20 ENCOUNTER — Other Ambulatory Visit: Payer: Self-pay

## 2020-07-20 DIAGNOSIS — G20 Parkinson's disease: Secondary | ICD-10-CM

## 2020-07-22 DIAGNOSIS — F4323 Adjustment disorder with mixed anxiety and depressed mood: Secondary | ICD-10-CM | POA: Diagnosis not present

## 2020-07-22 DIAGNOSIS — G20 Parkinson's disease: Secondary | ICD-10-CM | POA: Diagnosis not present

## 2020-07-26 MED ORDER — MIRTAZAPINE 15 MG PO TABS: 15.0000 mg | ORAL_TABLET | Freq: Every day | ORAL | 1 refills | Status: DC

## 2020-07-28 DIAGNOSIS — F4323 Adjustment disorder with mixed anxiety and depressed mood: Secondary | ICD-10-CM | POA: Diagnosis not present

## 2020-07-28 DIAGNOSIS — G20 Parkinson's disease: Secondary | ICD-10-CM | POA: Diagnosis not present

## 2020-07-30 ENCOUNTER — Telehealth: Payer: Self-pay | Admitting: Neurology

## 2020-07-30 NOTE — Telephone Encounter (Signed)
Left message for behavioral health to contact office with patients appointment date and time.

## 2020-07-30 NOTE — Telephone Encounter (Signed)
Can you call [facility] on [address] and find out if patient has an appt scheduled with Link Snuffer and let me know.

## 2020-08-03 NOTE — Telephone Encounter (Signed)
Left message for Behavioral health to contact office with patients appointment date and time.

## 2020-08-16 DIAGNOSIS — M9903 Segmental and somatic dysfunction of lumbar region: Secondary | ICD-10-CM | POA: Diagnosis not present

## 2020-08-16 DIAGNOSIS — M9905 Segmental and somatic dysfunction of pelvic region: Secondary | ICD-10-CM | POA: Diagnosis not present

## 2020-08-16 DIAGNOSIS — M9901 Segmental and somatic dysfunction of cervical region: Secondary | ICD-10-CM | POA: Diagnosis not present

## 2020-08-16 DIAGNOSIS — M9902 Segmental and somatic dysfunction of thoracic region: Secondary | ICD-10-CM | POA: Diagnosis not present

## 2020-08-17 DIAGNOSIS — G20 Parkinson's disease: Secondary | ICD-10-CM | POA: Diagnosis not present

## 2020-08-17 DIAGNOSIS — F4323 Adjustment disorder with mixed anxiety and depressed mood: Secondary | ICD-10-CM | POA: Diagnosis not present

## 2020-09-09 DIAGNOSIS — G20 Parkinson's disease: Secondary | ICD-10-CM | POA: Diagnosis not present

## 2020-09-09 DIAGNOSIS — F4323 Adjustment disorder with mixed anxiety and depressed mood: Secondary | ICD-10-CM | POA: Diagnosis not present

## 2020-09-14 DIAGNOSIS — M9903 Segmental and somatic dysfunction of lumbar region: Secondary | ICD-10-CM | POA: Diagnosis not present

## 2020-09-14 DIAGNOSIS — M9901 Segmental and somatic dysfunction of cervical region: Secondary | ICD-10-CM | POA: Diagnosis not present

## 2020-09-14 DIAGNOSIS — M9905 Segmental and somatic dysfunction of pelvic region: Secondary | ICD-10-CM | POA: Diagnosis not present

## 2020-09-14 DIAGNOSIS — M9902 Segmental and somatic dysfunction of thoracic region: Secondary | ICD-10-CM | POA: Diagnosis not present

## 2020-10-07 DIAGNOSIS — G20 Parkinson's disease: Secondary | ICD-10-CM | POA: Diagnosis not present

## 2020-10-07 DIAGNOSIS — F4323 Adjustment disorder with mixed anxiety and depressed mood: Secondary | ICD-10-CM | POA: Diagnosis not present

## 2020-10-12 DIAGNOSIS — M9905 Segmental and somatic dysfunction of pelvic region: Secondary | ICD-10-CM | POA: Diagnosis not present

## 2020-10-12 DIAGNOSIS — M9902 Segmental and somatic dysfunction of thoracic region: Secondary | ICD-10-CM | POA: Diagnosis not present

## 2020-10-12 DIAGNOSIS — M9903 Segmental and somatic dysfunction of lumbar region: Secondary | ICD-10-CM | POA: Diagnosis not present

## 2020-10-12 DIAGNOSIS — M9901 Segmental and somatic dysfunction of cervical region: Secondary | ICD-10-CM | POA: Diagnosis not present

## 2020-10-14 NOTE — Progress Notes (Signed)
Assessment/Plan:   1.  Parkinsons Disease  -Has had abnormal DaTscan in the past.  -Reviewed local clinical trials currently available, and do not think that there is anything that would be impactful for her.  -Would continue carbidopa/levodopa 25/100, 1 tablet 3 times per day.  She looks great on that medication  -Pt signed up for PF gene study.  She just got the kit.  -discussed nutrition - not a lot of good scientific data on it besides for mediterranean diet  -2nd opinion offered  2.  Depression /insomnia  -resolved on mirtazapine  -was likely due to adjustment d/o - due to new dx  -will have her stay on it for another 6 months or so and then may consider trying to d/c mirtazapine.     Subjective:   Elizabeth Woodard was seen today in follow up for Parkinsons disease.  My previous records were reviewed prior to todays visit as well as outside records available to me. Pt denies falls.  Pt denies lightheadedness, near syncope.  She was started on levodopa since last visit. With medication, she is having no tremor.  With very high stress, she may have a little tremor.   She is tolerating it well, without side effects.  Biggest complaint since last visit was sleep and we ended up starting her on mirtazapine for that.  She reports that it is working well.  In regards to mood, she states that she is doing well.  She is running and biking for exercise.  She does 30 min of cardio 7 days per week.  She does RSB 7 days per week.    Current prescribed movement disorder medications: Carbidopa/levodopa 25/100, 1 tablet 3 times per day Mirtazapine, 15 mg nightly (prescribed primarily for sleep)   PREVIOUS MEDICATIONS: Sinemet  ALLERGIES:  No Known Allergies  CURRENT MEDICATIONS:  Outpatient Encounter Medications as of 10/18/2020  Medication Sig  . amitriptyline (ELAVIL) 10 MG tablet Take 20 mg by mouth at bedtime.  . carbidopa-levodopa (SINEMET) 25-100 MG tablet Take 1 tablet by mouth 3  (three) times daily. 7am/11am/4pm  . cholestyramine (QUESTRAN) 4 g packet Take 4 g by mouth 2 (two) times daily.  . diazepam (VALIUM) 5 MG tablet Take 1 tablet 1 hour prior to procedure; may repeat 15 min before  . Lidocaine 4 % PTCH Apply topically. As needed when having blood work  . mirtazapine (REMERON) 15 MG tablet Take 1 tablet (15 mg total) by mouth at bedtime.   No facility-administered encounter medications on file as of 10/18/2020.    Objective:   PHYSICAL EXAMINATION:    VITALS:   Vitals:   10/18/20 1428  BP: 122/66  Pulse: 84  SpO2: 98%  Weight: 120 lb (54.4 kg)  Height: $Remove'4\' 11"'nvsioag$  (1.499 m)    GEN:  The patient appears stated age and is in NAD. HEENT:  Normocephalic, atraumatic.  The mucous membranes are moist. The superficial temporal arteries are without ropiness or tenderness. CV:  RRR Lungs:  CTAB Neck/HEME:  There are no carotid bruits bilaterally.  Neurological examination:  Orientation: The patient is alert and oriented x3. Cranial nerves: There is good facial symmetry without facial hypomimia. The speech is fluent and clear. Soft palate rises symmetrically and there is no tongue deviation. Hearing is intact to conversational tone. Sensation: Sensation is intact to light touch throughout Motor: Strength is at least antigravity x4.  Movement examination: Tone: There is nl tone in the UE/LE Abnormal movements: rare R thumb  tremor only with distraction Coordination:  There is no decremation with RAM's, with any form of RAMS, including alternating supination and pronation of the forearm, hand opening and closing, finger taps, heel taps and toe taps. Gait and Station: The patient has no difficulty arising out of a deep-seated chair without the use of the hands. The patient's stride length is neg.     Total time spent on today's visit was 32 minutes, including both face-to-face time and nonface-to-face time.  Time included that spent on review of records (prior  notes available to me/labs/imaging if pertinent), discussing treatment and goals, answering patient's questions and coordinating care.  Cc:  London Pepper, MD

## 2020-10-18 ENCOUNTER — Encounter: Payer: Self-pay | Admitting: Neurology

## 2020-10-18 ENCOUNTER — Other Ambulatory Visit: Payer: Self-pay

## 2020-10-18 ENCOUNTER — Ambulatory Visit (INDEPENDENT_AMBULATORY_CARE_PROVIDER_SITE_OTHER): Payer: BC Managed Care – PPO | Admitting: Neurology

## 2020-10-18 VITALS — BP 122/66 | HR 84 | Ht 59.0 in | Wt 120.0 lb

## 2020-10-18 DIAGNOSIS — G20 Parkinson's disease: Secondary | ICD-10-CM | POA: Diagnosis not present

## 2020-10-18 NOTE — Patient Instructions (Signed)
Online Resources for Power over Parkinson's Group May 2022  . Local [place] Online Groups  o Power over Parkinson's Group :    - Upcoming Power over Parkinson's Meetings:  2nd Wednesdays of the month at 2 pm:  June 8th, July 13th - Contact Amy Marriott at [email or web address] if interested in participating in this group o Parkinson's Care Partners Group:    3rd Mondays, Contact Misty Paladino o Atypical Parkinsonian Patient Group:   4th Wednesdays, Contact Misty Paladino o If you are interested in participating in these groups with Misty, please contact her directly for how to join those meetings.  Her contact information is [email or web address].   . Parkinson Foundation:  www.parkinson.org o PD Health at Home continues:  Mindfulness Mondays, Expert Briefing Tuesdays, Wellness Wednesdays, Take Time Thursdays, Fitness Fridays o Register for expert briefings (webinars) at [email or web address] o  Please check out their website to sign up for emails and see their full online offerings  . [employer]:  www.michaeljfox.org  o Check out additional information on their website to see their full online offerings  . [employer] Foundation:  www.davisphinneyfoundation.org o Upcoming Webinar:  Stay tuned o Care Partner Monthly Meetup.  With Connie Carpenter Phinney.  First Tuesday of each month, 2 pm o Joy Breaks:  First Wednesday of each month, 2-3 pm. There will be art, doodling, making, crafting, listening, laughing, stories, and everything in between. No art experience necessary. No supplies required. Just show up for joy!  Register on their website. o Check out additional information to Live Well Today on their website  . Parkinson and Movement Disorders (PMD) Alliance:  www.pmdalliance.org o [employer]:  Online Education Events o Sign up for emails, which are sent weekly to give you updates on programming and online  offerings     . Parkinson's Association of [place]:  www.parkinsonassociation.org o Information on online support groups, education events, and online exercises including Yoga, Parkinson's exercises and more-LOTS of information on links to PD resources and online events o Virtual Support Group through Parkinson's Association of [place]; next one is scheduled for Wednesday, May 4th, 2022 at 2 pm. (These are typically scheduled for the 1st Wednesday of the month at 2 pm).  Visit website for details.  . Additional links for movement activities: o PWR! Moves Classes at [facility] HAVE RESUMED!  Wednesdays 10 and 11 am.  Contact Amy Marriott, PT [email or web address] or [phone] if interested o Here is a link to the PWR!Moves classes on Zoom from [place] Parkinson's [facility] - Daily Mon-Sat at 10:00. Via Zoom, FREE and open to all.  There is also a link below via Facebook if you use that platform. - https://www.parkinsonsmi.org/mpf-programs/exercise-and-movement-activities - https://www.facebook.com/ParkinsonsMI.org/posts/pwr-moves-exercise-class-parkinson-wellness-recovery-online-with-angee-ludwa-pt-/10156827878021813/ o Parkinson's Wellness Recovery (PWR! Moves)  www.pwr4life.org - Info on the PWR! Virtual Experience:  You will have access to our expertise through self-assessment, guided plans that start with the PD-specific fundamentals, educational content, tips, Q&A with an expert, and a growing library of PD-specific pre-recorded and live exercise classes of varying types and intensity - both physical and cognitive! If that is not enough, we offer 1:1 wellness consultations (in-person or virtual) to personalize your PWR! Virtual Experience.  - Check out the PWR! Move of the month on the Parkinson Wellness Recovery website:  https://www.pwr4life.org/pwr-move-of-the-month-4/ o Parkinson Foundation Fitness Fridays:  - As part of the PD Health @ Home program,  this free video series focuses each week on one aspect of fitness designed to support people living with Parkinson's.    These weekly videos highlight the Parkinson Foundation recent fitness guidelines for people with Parkinson's disease. -  www.parkinson.org/understanding-parkinsons/coronavirus/PD-health-at-home/Fitness-Fridays o Dance for PD website is offering free, live-stream classes throughout the week, as well as links to digital library of classes:  https://danceforparkinsons.org/ o Dance for Parkinson's Class:  [employer] [place].  Free offering for people with Parkinson's and care partners; virtual class.  o For more information, contact [phone] or email Magalli Morana at [email or web address] o Virtual dance and Pilates for Parkinson's classes: Click on the Community Tab> Parkinson's Movement Initiative Tab.  To register for classes and for more information, visit www.americandancefestival.org and click the "community" tab.     o [employer] Parkinson's [facility]ing Classes  - [employer]: 1pm on Fridays-Live classes at [employer] (Contact Margaret Hazen at [email or web address] or [phone]) - [employer]: Virtual Classes Mondays and Thursdays /Live classes Tuesday, Wednesday and Thursday (contact Marlee at [email or web address]  or [phone])  o [place] [employer] - Three levels of classes are offered Tuesdays and Thursdays:  10:30 am,  12 noon & 1:45 pm at [employer] [facility].  - Active Stretching with [facility], New Class starting in March, on Fridays - To observe a class or for  more information, call [phone] or email [email or web address] . [employer]: o Online Caregiver Education Opportunities:  www.well-springsolutions.org/caregiver-education/caregiver-support-group.  You may also contact Jodi Kolada at [email or web address] or [phone].   o [facility] Navigator:  Just1Navigator program, a free service  to help individuals and families through the journey of determining care for older adults.  The "Navigator" is a social worker, Nicole Reynolds, who will speak with a prospective client and/or loved ones to provide an assessment of the situation and a set of recommendations for a personalized care plan -- all free of charge, and whether [facility] [employer] offers the needed service or not. If the need is not a service we provide, we are well-connected with reputable programs in town that we can refer you to.  www.well-springsolutions.org or to speak with the Navigator, call [phone].     

## 2020-11-09 DIAGNOSIS — M9905 Segmental and somatic dysfunction of pelvic region: Secondary | ICD-10-CM | POA: Diagnosis not present

## 2020-11-09 DIAGNOSIS — M9901 Segmental and somatic dysfunction of cervical region: Secondary | ICD-10-CM | POA: Diagnosis not present

## 2020-11-09 DIAGNOSIS — M9902 Segmental and somatic dysfunction of thoracic region: Secondary | ICD-10-CM | POA: Diagnosis not present

## 2020-11-09 DIAGNOSIS — Z1231 Encounter for screening mammogram for malignant neoplasm of breast: Secondary | ICD-10-CM | POA: Diagnosis not present

## 2020-11-09 DIAGNOSIS — M9903 Segmental and somatic dysfunction of lumbar region: Secondary | ICD-10-CM | POA: Diagnosis not present

## 2020-12-02 ENCOUNTER — Encounter: Payer: Self-pay | Admitting: Neurology

## 2020-12-07 DIAGNOSIS — M9901 Segmental and somatic dysfunction of cervical region: Secondary | ICD-10-CM | POA: Diagnosis not present

## 2020-12-07 DIAGNOSIS — M9905 Segmental and somatic dysfunction of pelvic region: Secondary | ICD-10-CM | POA: Diagnosis not present

## 2020-12-07 DIAGNOSIS — M9902 Segmental and somatic dysfunction of thoracic region: Secondary | ICD-10-CM | POA: Diagnosis not present

## 2020-12-07 DIAGNOSIS — M9903 Segmental and somatic dysfunction of lumbar region: Secondary | ICD-10-CM | POA: Diagnosis not present

## 2021-01-03 ENCOUNTER — Other Ambulatory Visit: Payer: Self-pay | Admitting: Neurology

## 2021-01-04 DIAGNOSIS — M9901 Segmental and somatic dysfunction of cervical region: Secondary | ICD-10-CM | POA: Diagnosis not present

## 2021-01-04 DIAGNOSIS — M9902 Segmental and somatic dysfunction of thoracic region: Secondary | ICD-10-CM | POA: Diagnosis not present

## 2021-01-04 DIAGNOSIS — M9905 Segmental and somatic dysfunction of pelvic region: Secondary | ICD-10-CM | POA: Diagnosis not present

## 2021-01-04 DIAGNOSIS — M9903 Segmental and somatic dysfunction of lumbar region: Secondary | ICD-10-CM | POA: Diagnosis not present

## 2021-01-05 ENCOUNTER — Other Ambulatory Visit: Payer: Self-pay

## 2021-01-06 ENCOUNTER — Other Ambulatory Visit: Payer: Self-pay

## 2021-01-06 MED ORDER — CARBIDOPA-LEVODOPA 25-100 MG PO TABS: 1.0000 | ORAL_TABLET | Freq: Three times a day (TID) | ORAL | 1 refills | Status: DC

## 2021-02-01 DIAGNOSIS — M9903 Segmental and somatic dysfunction of lumbar region: Secondary | ICD-10-CM | POA: Diagnosis not present

## 2021-02-01 DIAGNOSIS — M9902 Segmental and somatic dysfunction of thoracic region: Secondary | ICD-10-CM | POA: Diagnosis not present

## 2021-02-01 DIAGNOSIS — M9901 Segmental and somatic dysfunction of cervical region: Secondary | ICD-10-CM | POA: Diagnosis not present

## 2021-02-01 DIAGNOSIS — M9905 Segmental and somatic dysfunction of pelvic region: Secondary | ICD-10-CM | POA: Diagnosis not present

## 2021-03-01 DIAGNOSIS — M9901 Segmental and somatic dysfunction of cervical region: Secondary | ICD-10-CM | POA: Diagnosis not present

## 2021-03-01 DIAGNOSIS — M9905 Segmental and somatic dysfunction of pelvic region: Secondary | ICD-10-CM | POA: Diagnosis not present

## 2021-03-01 DIAGNOSIS — M9903 Segmental and somatic dysfunction of lumbar region: Secondary | ICD-10-CM | POA: Diagnosis not present

## 2021-03-01 DIAGNOSIS — M9902 Segmental and somatic dysfunction of thoracic region: Secondary | ICD-10-CM | POA: Diagnosis not present

## 2021-03-29 DIAGNOSIS — M9905 Segmental and somatic dysfunction of pelvic region: Secondary | ICD-10-CM | POA: Diagnosis not present

## 2021-03-29 DIAGNOSIS — M9903 Segmental and somatic dysfunction of lumbar region: Secondary | ICD-10-CM | POA: Diagnosis not present

## 2021-03-29 DIAGNOSIS — M9901 Segmental and somatic dysfunction of cervical region: Secondary | ICD-10-CM | POA: Diagnosis not present

## 2021-03-29 DIAGNOSIS — M9902 Segmental and somatic dysfunction of thoracic region: Secondary | ICD-10-CM | POA: Diagnosis not present

## 2021-04-25 NOTE — Progress Notes (Signed)
Virtual Visit Via Video   The purpose of this virtual visit is to provide medical care while limiting exposure to the novel coronavirus.    Consent was obtained for video visit:  Yes.   Answered questions that patient had about telehealth interaction:  Yes.   I discussed the limitations, risks, security and privacy concerns of performing an evaluation and management service by telemedicine. I also discussed with the patient that there may be a patient responsible charge related to this service. The patient expressed understanding and agreed to proceed.  Pt location: Home Physician Location: office Name of referring provider:  Farris Has, MD I connected with Elizabeth Woodard at patients initiation/request on 04/26/2021 at  3:30 PM EST by video enabled telemedicine application and verified that I am speaking with the correct person using two identifiers. Pt MRN:  [id number] Pt DOB:  1976/03/21 Video Participants:  Elizabeth Woodard;    Assessment/Plan:   1.  Parkinsons Disease  -Has had abnormal DaTscan in the past.  -Would continue carbidopa/levodopa 25/100, 1 tablet 3 times per day.  She looks great on that medication  -Genetics were negative.   2.  Depression /insomnia  -  We decided to try and wean off of the mirtazapine since we only started it as part of adjustment disorder when she was first diagnosed.  She is feeling much better now.  She will cut it in half for the next month and then she will stop the mirtazapine.  She will certainly let me know if she has any problems with it.   Subjective:   Elizabeth Woodard was seen today in follow up for Parkinsons disease.  My previous records were reviewed prior to todays visit as well as outside records available to me. She seems to be doing well on levodopa.  She is maintained on mirtazapine, which was really for adjustment disorder when she was first diagnosed.  She continues to exercise.  She has had no falls.  No lightheadedness or  near syncope.  No compulsive behaviors.  She signed up for the PF gene study and states that she was negative for known genetic abnormalities associated with Parkinson's disease.    Current prescribed movement disorder medications: Carbidopa/levodopa 25/100, 1 tablet 3 times per day Mirtazapine, 15 mg nightly (prescribed primarily for sleep)   PREVIOUS MEDICATIONS: Sinemet  ALLERGIES:  No Known Allergies  CURRENT MEDICATIONS:  Outpatient Encounter Medications as of 04/26/2021  Medication Sig   carbidopa-levodopa (SINEMET) 25-100 MG tablet Take 1 tablet by mouth 3 (three) times daily. 7am/11am/4pm   cholestyramine (QUESTRAN) 4 g packet Take 4 g by mouth 2 (two) times daily.   diazepam (VALIUM) 5 MG tablet Take 1 tablet 1 hour prior to procedure; may repeat 15 min before   Lidocaine 4 % PTCH Apply topically. As needed when having blood work   mirtazapine (REMERON) 15 MG tablet TAKE 1 TABLET(15 MG) BY MOUTH AT BEDTIME   [DISCONTINUED] amitriptyline (ELAVIL) 10 MG tablet Take 20 mg by mouth at bedtime. (Patient not taking: Reported on 04/26/2021)   No facility-administered encounter medications on file as of 04/26/2021.    Objective:   PHYSICAL EXAMINATION:    VITALS:   Vitals:   04/26/21 1501  Weight: 124 lb (56.2 kg)  Height: 4\' 11"  (1.499 m)     GEN:  The patient appears stated age and is in NAD. HEENT:  Normocephalic, atraumatic.    Neurological examination:  Orientation: The patient is alert and oriented x3. Cranial nerves:  There is good facial symmetry without facial hypomimia. The speech is fluent and clear.  Motor: Strength is at least antigravity x4.  Movement examination: Abnormal movements: none Coordination:  There is no decremation with RAM's, with any form of RAMS, including alternating supination and pronation of the forearm, hand opening and closing, finger taps Gait and Station: The patient has no difficulty arising out of a deep-seated chair without the  use of the hands. The patient's stride length is good.     Follow up Instructions      -I discussed the assessment and treatment plan with the patient. The patient was provided an opportunity to ask questions and all were answered. The patient agreed with the plan and demonstrated an understanding of the instructions.   The patient was advised to call back or seek an in-person evaluation if the symptoms worsen or if the condition fails to improve as anticipated.     Kerin Salen, DO   Cc:  Farris Has, MD

## 2021-04-26 ENCOUNTER — Other Ambulatory Visit: Payer: Self-pay

## 2021-04-26 ENCOUNTER — Encounter: Payer: Self-pay | Admitting: Neurology

## 2021-04-26 ENCOUNTER — Telehealth (INDEPENDENT_AMBULATORY_CARE_PROVIDER_SITE_OTHER): Payer: 59 | Admitting: Neurology

## 2021-04-26 VITALS — Ht 59.0 in | Wt 124.0 lb

## 2021-04-26 DIAGNOSIS — G20 Parkinson's disease: Secondary | ICD-10-CM

## 2021-07-13 ENCOUNTER — Other Ambulatory Visit: Payer: Self-pay | Admitting: Neurology

## 2021-07-13 DIAGNOSIS — G20 Parkinson's disease: Secondary | ICD-10-CM

## 2021-07-14 ENCOUNTER — Other Ambulatory Visit: Payer: Self-pay

## 2021-09-26 ENCOUNTER — Other Ambulatory Visit: Payer: Self-pay | Admitting: Neurology

## 2021-09-26 DIAGNOSIS — G20 Parkinson's disease: Secondary | ICD-10-CM

## 2021-09-27 ENCOUNTER — Encounter: Payer: Self-pay | Admitting: Neurology

## 2021-09-27 ENCOUNTER — Other Ambulatory Visit: Payer: Self-pay

## 2021-09-27 MED ORDER — CARBIDOPA-LEVODOPA 25-100 MG PO TABS: ORAL_TABLET | ORAL | 0 refills | Status: DC

## 2021-09-28 ENCOUNTER — Telehealth: Payer: Self-pay

## 2021-09-28 DIAGNOSIS — G20 Parkinson's disease: Secondary | ICD-10-CM

## 2021-09-28 MED ORDER — CARBIDOPA-LEVODOPA 25-100 MG PO TABS: ORAL_TABLET | ORAL | 0 refills | Status: DC

## 2021-09-28 NOTE — Telephone Encounter (Signed)
Refill sent to [facility] in [place]. ?

## 2021-11-08 ENCOUNTER — Encounter: Payer: Self-pay | Admitting: Neurology

## 2021-11-08 ENCOUNTER — Ambulatory Visit (INDEPENDENT_AMBULATORY_CARE_PROVIDER_SITE_OTHER): Payer: 59 | Admitting: Neurology

## 2021-11-08 DIAGNOSIS — G20 Parkinson's disease: Secondary | ICD-10-CM

## 2021-11-08 MED ORDER — CARBIDOPA-LEVODOPA 25-100 MG PO TABS
ORAL_TABLET | ORAL | 2 refills | Status: DC
Start: 2021-11-08 — End: 2022-01-11

## 2021-12-22 IMAGING — NM NM DATSCAN
2 series · 12 of 12 positions shown · non-contrast
Comparison: None available

CLINICAL DATA: 44-year-old with RIGHT hand tremors. Bradykinesia,
rigidity.

EXAM:
NUCLEAR MEDICINE BRAIN IMAGING WITH SPECT  (DaTscan )
TECHNIQUE: SPECT images of the brain were obtained after intravenous injection
of radiopharmaceutical. 4 hour post injection imaging. Appropriate
positioning. 130 mg i-STAT given orally for thyroid blockade.
RADIOPHARMACEUTICALS:  4.8 millicuries I 123 Ioflupane

[Series 1: spect - 159 kev _(id)_tra · 4.1mm · 4.14mm/px · 6 of 128 frames shown]
[frame 11/128]
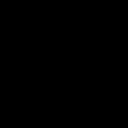
[frame 32/128]
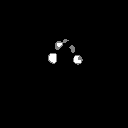
[frame 54/128]
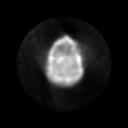
[frame 75/128]
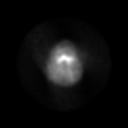
[frame 96/128]
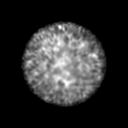
[frame 118/128]
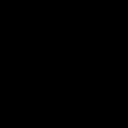

[Series 1: spect - 159 kev _(id)_cor · 4.1mm · 4.14mm/px · 6 of 128 frames shown]
[frame 11/128]
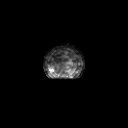
[frame 32/128]
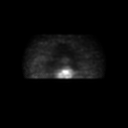
[frame 54/128]
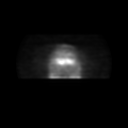
[frame 75/128]
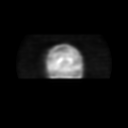
[frame 96/128]
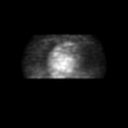
[frame 118/128]
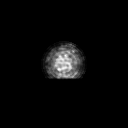

[12 of 12 positions shown; findings below may reference images not displayed]

FINDINGS: There is decrease activity within the posterior LEFT striatum
(putamen). Posterior RIGHT striatum is slightly thinned. Symmetric
normal activity in the heads of the caudate nuclei.
IMPRESSION: Asymmetric decreased radiotracer activity within the posterior LEFT
striatum indicative of loss of dopamine transport populations in
this region.

Of note, DaTSCAN is not diagnostic of Parkinsonian syndromes, which
remains a clinical diagnosis. DaTscan is an adjuvant test to aid in
the clinical diagnosis of Parkinsonian syndromes.

These results will be called to the ordering clinician or
representative by the Radiologist Assistant, and communication
documented in the PACS or [REDACTED].

## 2022-01-11 ENCOUNTER — Encounter: Payer: Self-pay | Admitting: Neurology

## 2022-01-11 ENCOUNTER — Other Ambulatory Visit: Payer: Self-pay

## 2022-01-11 DIAGNOSIS — G20 Parkinson's disease: Secondary | ICD-10-CM

## 2022-01-11 MED ORDER — CARBIDOPA-LEVODOPA 25-100 MG PO TABS: ORAL_TABLET | ORAL | 0 refills | Status: DC

## 2022-01-19 ENCOUNTER — Other Ambulatory Visit (HOSPITAL_COMMUNITY): Payer: Self-pay

## 2022-03-23 ENCOUNTER — Other Ambulatory Visit: Payer: Self-pay | Admitting: Neurology

## 2022-03-23 DIAGNOSIS — G20A1 Parkinson's disease without dyskinesia, without mention of fluctuations: Secondary | ICD-10-CM

## 2022-03-24 MED ORDER — CARBIDOPA-LEVODOPA 25-100 MG PO TABS: ORAL_TABLET | ORAL | 0 refills | Status: DC

## 2022-05-04 NOTE — Progress Notes (Signed)
Assessment/Plan:   1.  Parkinsons Disease  -Has had abnormal DaTscan in the past.  -Reviewed local clinical trials currently available.  Printed out info on [facility] at [facility] in case interested  -Would continue carbidopa/levodopa 25/100, 1 tablet 3 times per day.  She looks great on that medication  -did PF GENEration study and was neg for known genetics  2.  Bilateral retinal hemorrhages  -Patient has been following with optometry and while the right side has resolved, the left is actually worse 3 months later, according to notes.  -I discussed with patient that this has nothing to do with Parkinsons disease.  -I would like the patient to get in with ophthalmology and she is agreeable.   -pt to f/u with pcp for labs requested by optometry.  Pt does state that she had recent labs including A1C for life insurance and they were good Subjective:   Elizabeth Woodard was seen today in follow up for Parkinsons disease.  My previous records were reviewed prior to todays visit as well as outside records available to me. Pt has been doing well since last visit.  Working without difficulty.  Exercising.  No falls.  I got a note last week from regarding eye care.  She saw her optometrist December 18 which was apparently a 43-month follow-up.  Apparently, she had some type of retinal hemorrhage 3 months earlier and was following up on December 18.  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   He writes in his note that 3 months later, the left eye was actually worse than the right I had resolved.  He wrote that he felt that this was due to stress and ischemia from her "overall clinical picture."  He also wondered if Parkinsons had some type of effect on the retina.  He requested her primary care do a CBC and A1c and check for sickle cell.  Current prescribed movement disorder medications: Carbidopa/levodopa 25/100, 1 tablet 3 times per day    PREVIOUS MEDICATIONS:  Sinemet; mirtazapine (did well on it, but just did not need it any longer)  ALLERGIES:  No Known Allergies  CURRENT MEDICATIONS:  Outpatient Encounter Medications as of 05/16/2022  Medication Sig   carbidopa-levodopa (SINEMET IR) 25-100 MG tablet TAKE 1 TABLET BY MOUTH THREE TIMES DAILY AT 7 AM AND AT 11 AM AND AT 4 PM   cholestyramine (QUESTRAN) 4 g packet Take 4 g by mouth 2 (two) times daily.   diazepam (VALIUM) 5 MG tablet Take 1 tablet 1 hour prior to procedure; may repeat 15 min before (Patient not taking: Reported on 11/08/2021)   Lidocaine 4 % PTCH Apply topically. As needed when having blood work (Patient not taking: Reported on 11/08/2021)   [DISCONTINUED] mirtazapine (REMERON) 15 MG tablet TAKE 1 TABLET(15 MG) BY MOUTH AT BEDTIME   No facility-administered encounter medications on file as of 05/16/2022.    Objective:   PHYSICAL EXAMINATION:    VITALS:   Vitals:   05/16/22 0810  BP: 126/78  Pulse: 60  SpO2: 97%  Weight: 128 lb 9.6 oz (58.3 kg)  Height: 4\' 11"  (1.499 m)    GEN:  The patient appears stated age and is in NAD. HEENT:  Normocephalic, atraumatic.  The mucous membranes are moist. The superficial temporal arteries are without ropiness or tenderness. CV:  RRR Lungs:  CTAB  Neurological examination:  Orientation: The patient is alert and oriented x3. Cranial nerves: There  is good facial symmetry without facial hypomimia. The speech is fluent and clear. Hearing is intact to conversational tone. Sensation: Sensation is intact to light touch throughout Motor: Strength is at least antigravity x4.  Movement examination: Tone: There is nl tone in the UE/LE Abnormal movements: No tremor is seen today  Coordination:  There is no decremation with RAM's, with any form of RAMS, including alternating supination and pronation of the forearm, hand opening and closing, finger taps, heel taps and toe taps bilaterally. Gait and Station: The patient has no difficulty arising  out of a deep-seated chair without the use of the hands. The patient's stride length is neg.    Cc:  London Pepper, MD

## 2022-05-16 ENCOUNTER — Ambulatory Visit (INDEPENDENT_AMBULATORY_CARE_PROVIDER_SITE_OTHER): Payer: Managed Care, Other (non HMO) | Admitting: Neurology

## 2022-05-16 ENCOUNTER — Telehealth: Payer: Self-pay

## 2022-05-16 VITALS — BP 126/78 | HR 60 | Ht 59.0 in | Wt 128.6 lb

## 2022-05-16 DIAGNOSIS — H3563 Retinal hemorrhage, bilateral: Secondary | ICD-10-CM

## 2022-05-16 DIAGNOSIS — G20A1 Parkinson's disease without dyskinesia, without mention of fluctuations: Secondary | ICD-10-CM

## 2022-05-16 MED ORDER — CARBIDOPA-LEVODOPA 25-100 MG PO TABS: ORAL_TABLET | ORAL | 2 refills | Status: DC

## 2022-05-16 NOTE — Telephone Encounter (Signed)
Called patient after securing her an appointment at [facility] # [phone] Phone [phone] Fax [phone] Patient did not answer and I left a detailed message

## 2022-07-03 ENCOUNTER — Other Ambulatory Visit: Payer: Self-pay | Admitting: Family Medicine

## 2022-07-03 DIAGNOSIS — Z1231 Encounter for screening mammogram for malignant neoplasm of breast: Secondary | ICD-10-CM

## 2022-07-06 ENCOUNTER — Ambulatory Visit
Admission: RE | Admit: 2022-07-06 | Discharge: 2022-07-06 | Disposition: A | Discharge status: Home / Self Care | Payer: Managed Care, Other (non HMO) | Source: Ambulatory Visit

## 2022-07-06 DIAGNOSIS — Z1231 Encounter for screening mammogram for malignant neoplasm of breast: Secondary | ICD-10-CM

## 2022-09-11 ENCOUNTER — Encounter: Payer: Self-pay | Admitting: Neurology

## 2022-09-12 ENCOUNTER — Other Ambulatory Visit: Payer: Self-pay

## 2022-09-12 DIAGNOSIS — G20A1 Parkinson's disease without dyskinesia, without mention of fluctuations: Secondary | ICD-10-CM

## 2022-09-12 MED ORDER — CARBIDOPA-LEVODOPA 25-100 MG PO TABS: ORAL_TABLET | ORAL | 0 refills | Status: DC

## 2022-11-10 NOTE — Progress Notes (Unsigned)
Assessment/Plan:   1.  Parkinsons Disease  -We discussed that it used to be thought that levodopa would increase risk of melanoma but now it is believed that Parkinsons itself likely increases risk of melanoma. she is to get regular skin checks.  Resources given  -Has had abnormal DaTscan in the past.  -Reviewed local clinical trials currently available.  Printed out info on LUMA at [facility] in case interested  -Would continue carbidopa/levodopa 25/100, 1 tablet 3 times per day.  She looks great on that medication  -We discussed that it used to be thought that levodopa would increase risk of melanoma but now it is believed that Parkinsons itself likely increases risk of melanoma. she is to get regular skin checks.   -did PF GENEration study and was neg for known genetics  2.  Bilateral retinal hemorrhages  -Patient has seen [facility] Subjective:   STARKEISHA BANTA was seen today in follow up for Parkinsons disease.  My previous records were reviewed prior to todays visit as well as outside records available to me.  Patient's Parkinson's disease has been stable since our last visit.  She has had no falls.   Been stressful time and some internal tremor with that   She continues to exercise faithfully.  When I saw her last visit, she had been following with optometry and had been having bilateral retinal hemorrhages.  I had talked to ophthalmology and they had strongly recommended she see a retina specialist.  I referred her to [place] [facility].   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  Current prescribed movement disorder medications: Carbidopa/levodopa 25/100, 1 tablet 3 times per day  PREVIOUS MEDICATIONS: Sinemet; mirtazapine (did well on it, but just did not need it any longer)  ALLERGIES:  No Known Allergies  CURRENT MEDICATIONS:  Outpatient  Encounter Medications as of 11/14/2022  Medication Sig   carbidopa-levodopa (SINEMET IR) 25-100 MG tablet TAKE 1 TABLET BY MOUTH THREE TIMES DAILY AT 7 AM AND AT 11 AM AND AT 4 PM   cholestyramine (QUESTRAN) 4 g packet Take 4 g by mouth 2 (two) times daily.   No facility-administered encounter medications on file as of 11/14/2022.    Objective:   PHYSICAL EXAMINATION:    VITALS:   Vitals:   11/14/22 0814  BP: 122/84  Pulse: 66  SpO2: 99%  Weight: 128 lb 9.6 oz (58.3 kg)  Height: 4\' 11"  (1.499 m)   GEN:  The patient appears stated age and is in NAD. HEENT:  Normocephalic, atraumatic.  The mucous membranes are moist. The superficial temporal arteries are without ropiness or tenderness. CV:  RRR Lungs:  CTAB  Neurological examination:  Orientation: The patient is alert and oriented x3. Cranial nerves: There is good facial symmetry without facial hypomimia. The speech is fluent and clear. Hearing is intact to conversational tone. Sensation: Sensation is intact to light touch throughout Motor: Strength is at least antigravity x4.  Movement examination: Tone: There is nl tone in the UE/LE Abnormal movements: No tremor is seen today  Coordination:  There is no decremation with RAM's, with any form of RAMS, including alternating supination and pronation of the forearm, hand opening and closing, finger taps, heel taps and toe taps bilaterally. Gait and Station: The patient has no difficulty arising out of a deep-seated chair without the use of  the hands. The patient's stride length is good/nl.    Cc:  Farris Has, MD

## 2022-11-14 ENCOUNTER — Encounter: Payer: Self-pay | Admitting: Neurology

## 2022-11-14 ENCOUNTER — Ambulatory Visit (INDEPENDENT_AMBULATORY_CARE_PROVIDER_SITE_OTHER): Payer: Managed Care, Other (non HMO) | Admitting: Neurology

## 2022-11-14 VITALS — BP 122/84 | HR 66 | Ht 59.0 in | Wt 128.6 lb

## 2022-11-14 DIAGNOSIS — G20A1 Parkinson's disease without dyskinesia, without mention of fluctuations: Secondary | ICD-10-CM

## 2022-11-14 NOTE — Patient Instructions (Signed)
As we discussed, it used to be thought that levodopa would increase risk of melanoma but now it is believed that Parkinsons itself likely increases risk of melanoma. I recommend yearly skin checks with a board certified dermatologist.  You can call [facility] or [facility] for an appointment.  [facility] Address: [address] [place], [place], [place] [address] Phone: [phone]  [facility] [address] [place], [place], [place] Phone: [phone]  Local and Online Resources for Power over Parkinson's Group  June 2024   LOCAL [place] PARKINSON'S GROUPS   Power over Parkinson's Group:    Power Over Parkinson's Patient Education Group will be Wednesday, [place] 10th-*PLEASE NOTE:  We will not be having a June meeting.  Our next meeting will be in July.  We apologize for the inconvenience.   Power over [employer] and Care Partner Groups will meet together, with plans for separate break out session for caregivers, depending on topic/speaker Upcoming Power over Parkinson's Meetings/Care Partner Support:  2nd Wednesdays of the month at 2 pm:   No regular June meeting, July 10th  Contact Amy Marriott at [email or web address] or Lynwood Dawley at [email or web address] if interested in participating in this group    LOCAL EVENTS AND NEW OFFERINGS  PLEASE NOTE:  There will be no June Power over [employer] meeting [facility] June 12th) Picnic Party for [employer].  [facility] Movement Disorders Program Celebration.  June 19th 1-3 pm.  Contact Lynwood Dawley at [email or web address] Parkinson's Social Game Night.  First Thursday of each month, 2:00-4:00 pm.  *Next date is June 6th*.  Rossie Muskrat [employer], [employer].  Contact [email or web address] if interested. Parkinson's CarePartner Group for Men is in the works, if interested email [email or web address] ACT FITNESS Chair Yoga classes "Train and Gain", Fridays 10 am, ACT Fitness.  Contact Gina at [phone].  Health visitor Classes offering at [employer]!  TUESDAYS (Chair Yoga)  and Wednesdays (PWR! Moves)  1:00 pm.   Contact Synetta Shadow at  [email or web address]  or [phone]  Antoine Poche! Moves classes.  Thursdays at 11:45, [facility].  Free to participate through [employer].  Contact Lynwood Dawley at [email or web address] or [phone] to register Drumming for Parkinson's will be held on 2nd and 4th Mondays at 11:00 am.   Located at the [place] of the [employer] ([address] [place].)  Contact Albertina Parr at [email or web address] or [phone]  [facility] Parkinson's Tai Chi Class, Mondays at 11 am.  Call [phone] for details   [facility] EDUCATION AND SUPPORT  Parkinson Foundation:  www.parkinson.org  PD Health at Home continues:  Mindfulness Mondays, Wellness Wednesdays, Fitness Fridays  (PWR! Moves as part of Fitness Fridays March 22nd, 1-1:45 pm) Upcoming Education:   Current and Emerging Methods to Aid Parkinson's Diagnosis.  Wednesday, May 29th, 1-2 pm Recognize and Respond to Parkinson's Psychosis.  Wednesday, June 5th, 1-2 pm Parkinsonisms.  Wednesday, June 12th, 1-2 pm Expert Briefing:  Addressing the Challenge of Apathy in Parkinson's.  Wednesday, September 11th, 1-2 pm. Register for virtual education and Photographer (webinars) at ElectroFunds.gl Please check out their website to sign up for emails and see their full online offerings     [employer] Foundation:  www.[email or web address].org   Third Thursday Webinars:  On the third Thursday of every month at 12 p.m. ET, join our free live webinars to learn about various aspects of living with Parkinson's disease and our  work to speed  medical breakthroughs.  Upcoming Webinar:  You Want to [employer] for Parkinson's Research: Now What?  Thursday, June 20th at 12 noon. Check out additional information on their website to see their full online offerings    [facility]:  www.[email or web address].org  Upcoming Webinar:  Living Safely with Parkinson's Inside and Outside of your Home.  Thursday, June 13th , 3 pm Series:  Living with Parkinson's Meetup.   Third Thursdays each month, 3 pm  Care Partner Monthly Meetup.  With Jillene Bucks Phinney.  First Tuesday of each month, 2 pm  Check out additional information to Live Well Today on their website    Parkinson and Movement Disorders (PMD) Alliance:  www.pmdalliance.org  NeuroLife Online:  Online Education Events  Sign up for emails, which are sent weekly to give you updates on programming and online offerings    Parkinson's Association of the [place]:  www.parkinsonassociation.org  Information on online support groups, education events, and online exercises including Yoga, Parkinson's exercises and more-LOTS of information on links to PD resources and online events  Virtual Support Group through Parkinson's Association of the [place]; next one is scheduled for Wednesday, June 5th   MOVEMENT AND EXERCISE OPPORTUNITIES  Parkinson's Exercise Class offerings at [employer]. *TUESDAYS* (Chair yoga) and Wednesdays (PWR! Moves)  1:00 pm.   *Please note that PWR! Moves [place] class has ended.  Please consider trying PWR! Moves on Wednesdays at 1 pm at [facility].  Contact Synetta Shadow at [email or web address]    Parkinson's Wellness Recovery (PWR! Moves)  www.pwr4life.org  Info on the PWR! Virtual Experience:  You will have access to our expertise?through self-assessment, guided plans that start with the PD-specific fundamentals, educational content, tips, Q&A with an expert, and a growing Engineering geologist of PD-specific pre-recorded and live exercise classes  of varying types and intensity - both physical and cognitive! If that is not enough, we offer 1:1 wellness consultations (in-person or virtual) to personalize your PWR! Dance movement psychotherapist.   Parkinson [employer] Fridays:   As part of the PD Health @ Home program, this free video series focuses each week on one aspect of fitness designed to support people living with Parkinson's.? These weekly videos highlight the Parkinson Foundation fitness guidelines for people with Parkinson's disease.  MenusLocal.com.br  Dance for PD website is offering free, live-stream classes throughout the week, as well as links to [employer] of classes:  https://danceforparkinsons.org/  Virtual dance and Pilates for Parkinson's classes: Click on the Community Tab> Parkinson's Movement Initiative Tab.  To register for classes and for more information, visit www.[email or web address].co.za and click the "community" tab.   [employer] Parkinson's Cycling Classes   [facility] [employer]:  Thursdays @ Noon-Live classes at [employer] ([employer] at [email or web address]?or [phone])  [facility]: Classes Tuesday, Wednesday and Thursday (contact [place] at [email or web address] ?or [phone])  [facility] [employer]  Varied levels of classes are offered Tuesdays and Thursdays at [employer].   Stretching with Byrd Hesselbach weekly class is also offered for people with Parkinson's  To observe a class or for more information, call [phone] or email Patricia Nettle at [email or web address]   ADDITIONAL SUPPORT AND RESOURCES  Well-Spring Solutions:  Online Caregiver Education Opportunities:  www.well-springsolutions.org/caregiver-education/caregiver-support-group.  You may also contact Loleta Chance at [facility] -spring.org or [phone].     Well-Spring Navigator:  Just1Navigator program, a?free service to help individuals and  families through the journey of determining care for older adults.  The "Navigator" is a Child psychotherapist, Sidney Ace, who  will speak with a prospective client and/or loved ones to provide an assessment of the situation and a set of recommendations for a personalized care plan -- all free of charge, and whether?Well-Spring Solutions offers the needed service or not. If the need is not a service we provide, we are well-connected with reputable programs in town that we can refer you to.  www.well-springsolutions.org or to speak with the Navigator, call [phone].

## 2022-12-24 ENCOUNTER — Other Ambulatory Visit: Payer: Self-pay | Admitting: Neurology

## 2022-12-24 DIAGNOSIS — G20A1 Parkinson's disease without dyskinesia, without mention of fluctuations: Secondary | ICD-10-CM

## 2022-12-25 MED ORDER — CARBIDOPA-LEVODOPA 25-100 MG PO TABS: ORAL_TABLET | ORAL | 1 refills | Status: DC

## 2023-04-19 DIAGNOSIS — N951 Menopausal and female climacteric states: Secondary | ICD-10-CM | POA: Diagnosis not present

## 2023-04-23 DIAGNOSIS — R454 Irritability and anger: Secondary | ICD-10-CM | POA: Diagnosis not present

## 2023-04-23 DIAGNOSIS — N951 Menopausal and female climacteric states: Secondary | ICD-10-CM | POA: Diagnosis not present

## 2023-04-23 DIAGNOSIS — R232 Flushing: Secondary | ICD-10-CM | POA: Diagnosis not present

## 2023-04-23 DIAGNOSIS — N898 Other specified noninflammatory disorders of vagina: Secondary | ICD-10-CM | POA: Diagnosis not present

## 2023-05-15 DIAGNOSIS — M9902 Segmental and somatic dysfunction of thoracic region: Secondary | ICD-10-CM | POA: Diagnosis not present

## 2023-05-15 DIAGNOSIS — M9901 Segmental and somatic dysfunction of cervical region: Secondary | ICD-10-CM | POA: Diagnosis not present

## 2023-05-15 DIAGNOSIS — M9905 Segmental and somatic dysfunction of pelvic region: Secondary | ICD-10-CM | POA: Diagnosis not present

## 2023-05-15 DIAGNOSIS — M9903 Segmental and somatic dysfunction of lumbar region: Secondary | ICD-10-CM | POA: Diagnosis not present

## 2023-05-18 NOTE — Patient Instructions (Addendum)
 You may check out the Facebook page Parkinsons Fight club if you would like  You continue to look at do well!  No need to change medication right now.  Local and Online Resources for Power over Parkinson's Group?  January 2025 ?  LOCAL [place] PARKINSON'S GROUPS??  Power over Parkinson's Group:???  Upcoming Power over [employer] Meetings/Care Partner Support:? 2nd Wednesdays of the month at 2 pm:  January 8th, February 12th Contact Lauraine Bend at [email or web address] or Amy Marriott at [email or web address] if interested in participating in this group?  ?  LOCAL EVENTS AND NEW OFFERINGS?  Dance Project Spring 2025:  January 14-May 20, Tuesdays 10-11 am.  All details on website: [email or web address].is ACT FITNESS Chair Yoga classes "Train and Gain", Fridays 10 am, ACT Fitness.  Contact Gina at [phone].   PWR! Moves [place] class!  Wednesdays at 10 am.  Please contact Greig Anon, PT at [email or web address] if interested. Health Visitor Classes offering at [employer]!? Tuesdays (Chair Yoga)  and Wednesdays (PWR! Moves)  1:00 pm.?? Contact Garrel Challenger [phone] or [email or web address] Drumming for Parkinson's will be held on 2nd and 4th Mondays at 11:00 am.?? Located at the [place] of the [place] ([address] [place]. [place].) *Next class is January 13th.? Contact Jane Maydian at [email or web address] or [phone]?  [facility] [employer] Parkinson's Tai Chi Class, Mondays at 11 am.  Call [phone] for details  [facility] at [facility]- [address], [place] Wednesdays- 4:00 - 5:00 PM - specifically for Parkinson's Disease.  Free!  Contact Ozell Higashi, [place] - [phone] (clinic) or  [phone] (cell) or by email: [email or web address]   ?ONLINE EDUCATION AND SUPPORT?  Parkinson Foundation:? www.parkinson.org?   PD Health at Home continues:? Mindfulness Mondays, Wellness Wednesdays, Fitness Fridays??  Upcoming Education:??  Empowerment through Movement, Wednesday, January 15th, 1-2 pm A Deep Dive into Deep Brain Stimulation (DBS), Wednesday, January 29th, 1-2 pm Expert Briefing:    Stay tuned Register for virtual education and expert briefings (webinars) at electrofunds.gl  Please check out their website to sign up for emails and see their full online offerings??  ?  [place] JINNY Baseman Foundation:? www.[email or web address].org??  Third Thursday Webinars:? On the third Thursday of every month at 12 p.m. ET, join our free live webinars to learn about various aspects of living with Parkinson's disease and our work to speed medical breakthroughs.?  Upcoming Webinar:? Managing the Hidden Symptoms:  Mood and Motivation Changes in Parkinson's.  Thursday, January 16th at 12 noon.  Check out additional information on their website to see their full online offerings?  ?  [employer]:? www.[email or web address].org?  Upcoming Webinar:   Stay tuned Series:? Living with Parkinson's Meetup.?? Third Thursdays each month, 3 pm?  Care Partner Monthly Meetup.? With Jori Axe Phinney.? First Tuesday of each month, 2 pm?  Check out additional information to Live Well Today on their website?  ?  Parkinson and Movement Disorders (PMD) Alliance:? www.pmdalliance.org?  NeuroLife Online:? Online Education Events?  Sign up for emails, which are sent weekly to give you updates on programming and online offerings?  ?  Parkinson's Association of the [place]:? www.parkinsonassociation.org?  Information on online support groups, education events, and online exercises including Yoga, Parkinson's exercises and more-LOTS of information on links to PD resources and online events?  Virtual Support Group through [employer] of the [place]-First Wednesday of each month at 2  pm   MOVEMENT AND EXERCISE OPPORTUNITIES?  PWR! Moves [place] class has returned!  Wednesdays at 10 am.  Please contact Greig Anon, PT at [email or web address] if interested. Parkinson's Exercise Class offerings at [employer]. Tuesdays (Chair yoga) and Wednesdays (PWR! Moves)  1:00 pm.?  Contact Garrel Challenger [phone] or [email or web address]  Parkinson's Wellness Recovery (PWR! Moves)? www.pwr4life.org?  Info on the PWR! Virtual Experience:? You will have access to our expertise?through self-assessment, guided plans that start with the PD-specific fundamentals, educational content, tips, Q&A with an expert, and a growing engineering geologist of PD-specific pre-recorded and live exercise classes of varying types and intensity - both physical and cognitive! If that is not enough, we offer 1:1 wellness consultations (in-person or virtual) to personalize your PWR! Dance Movement Psychotherapist.??  Parkinson [employer] Fridays:??  As part of the PD Health @ Home program, this free video series focuses each week on one aspect of fitness designed to support people living with Parkinson's.? These weekly videos highlight the Parkinson Foundation fitness guidelines for people with Parkinson's disease.?  menuslocal.com.br?  Dance for PD website is offering free, live-stream classes throughout the week, as well as links to parker hannifin of classes:? https://danceforparkinsons.org/?  Virtual dance and Pilates for Parkinson's classes: Click on the Community Tab> Parkinson's Movement Initiative Tab.? To register for classes and for more information, visit www.noteback.co.za and click the "community" tab.??  [employer] Parkinson's Cycling Classes??  [facility] [employer]:? Thursdays @ Noon-Live classes at [employer] ([employer] at [email or web address]?or [phone])?  [place] [facility]: Classes Tuesday, Wednesday and Thursday (contact  [place] at [email or web address] ?or [phone])?  [place] [employer]?  Varied levels of classes are offered Tuesdays and Thursdays at [facility].??  Stretching with [place]assah weekly class is also offered for people with Parkinson's?  To observe a class or for more information, call [phone] or email Rolland Dines at [email or web address]?    ADDITIONAL SUPPORT AND RESOURCES?  Well-Spring Solutions:  Chiropractor:? www.well-springsolutions.org/caregiver-education/caregiver-support-group.? You may also contact Jodi Kolada at [facility] -spring.org or [phone].????  Well-Spring Navigator:? Just1Navigator program, a?free service to help individuals and families through the journey of determining care for older adults.? The "Navigator" is a child psychotherapist, Nat Hock, who will speak with a prospective client and/or loved ones to provide an assessment of the situation and a set of recommendations for a personalized care plan -- all free of charge, and whether?Well-Spring Solutions offers the needed service or not. If the need is not a service we provide, we are well-connected with reputable programs in town that we can refer you to.? www.well-springsolutions.org or to speak with the Navigator, call [phone].?

## 2023-05-18 NOTE — Progress Notes (Signed)
    Assessment/Plan:   1.  Parkinsons Disease  -We discussed that it used to be thought that levodopa  would increase risk of melanoma but now it is believed that Parkinsons itself likely increases risk of melanoma. she is to get regular skin checks.  Resources given  -Has had abnormal DaTscan  in the past.  -Would continue carbidopa /levodopa  25/100, 1 tablet 3 times per day.  She looks great on that medication.  No need for increasing it or adding another class or medication  -We discussed that it used to be thought that levodopa  would increase risk of melanoma but now it is believed that Parkinsons itself likely increases risk of melanoma. she is to get regular skin checks.   -did PF GENEration study and was neg for known genetics  2.  Bilateral retinal hemorrhages  -Patient has seen [facility] Subjective:   Elizabeth Woodard was seen today in follow up for Parkinsons disease.  My previous records were reviewed prior to todays visit as well as outside records available to me.  Patient's Parkinson's disease has been stable since our last visit.  Pt denies falls.  Exercising faithfully.  Pt denies lightheadedness, near syncope.  No hallucinations.  Mood is good.    Current prescribed movement disorder medications: Carbidopa /levodopa  25/100, 1 tablet 3 times per day  PREVIOUS MEDICATIONS: Sinemet ; mirtazapine  (did well on it, but just did not need it any longer)  ALLERGIES:  No Known Allergies  CURRENT MEDICATIONS:  Outpatient Encounter Medications as of 05/22/2023  Medication Sig   carbidopa -levodopa  (SINEMET  IR) 25-100 MG tablet TAKE 1 TABLET BY MOUTH THREE TIMES DAILY AT 7 AM AND AT 11 AM AND AT 4 PM   cholestyramine (QUESTRAN) 4 g packet Take 4 g by mouth 2 (two) times daily.   testosterone  (ANDROGEL ) 50 MG/5GM (1%) GEL Place 5 g onto the skin daily.   No facility-administered encounter medications on file as of 05/22/2023.    Objective:   PHYSICAL EXAMINATION:    VITALS:    Vitals:   05/22/23 0825  BP: 122/84  Pulse: 63  SpO2: 98%  Weight: 134 lb 12.8 oz (61.1 kg)  Height: 4' 11 (1.499 m)    GEN:  The patient appears stated age and is in NAD. HEENT:  Normocephalic, atraumatic.  The mucous membranes are moist. The superficial temporal arteries are without ropiness or tenderness. CV:  RRR Lungs:  CTAB  Neurological examination:  Orientation: The patient is alert and oriented x3. Cranial nerves: There is good facial symmetry without facial hypomimia. The speech is fluent and clear. Hearing is intact to conversational tone. Sensation: Sensation is intact to light touch throughout Motor: Strength is at least antigravity x4.  Movement examination: Tone: There is nl tone in the UE/LE Abnormal movements: No tremor is seen today  Coordination:  There is no decremation with RAM's, with any form of RAMS, including alternating supination and pronation of the forearm, hand opening and closing, finger taps, heel taps and toe taps bilaterally. Gait and Station: The patient has no difficulty arising out of a deep-seated chair without the use of the hands. The patient's stride length is good/nl.    Cc:  Kip Righter, MD

## 2023-05-22 ENCOUNTER — Encounter: Payer: Self-pay | Admitting: Neurology

## 2023-05-22 ENCOUNTER — Ambulatory Visit (INDEPENDENT_AMBULATORY_CARE_PROVIDER_SITE_OTHER): Payer: BC Managed Care – PPO | Admitting: Neurology

## 2023-05-22 VITALS — BP 122/84 | HR 63 | Ht 59.0 in | Wt 134.8 lb

## 2023-05-22 DIAGNOSIS — G20A1 Parkinson's disease without dyskinesia, without mention of fluctuations: Secondary | ICD-10-CM

## 2023-06-12 DIAGNOSIS — M9905 Segmental and somatic dysfunction of pelvic region: Secondary | ICD-10-CM | POA: Diagnosis not present

## 2023-06-12 DIAGNOSIS — M9902 Segmental and somatic dysfunction of thoracic region: Secondary | ICD-10-CM | POA: Diagnosis not present

## 2023-06-12 DIAGNOSIS — M9903 Segmental and somatic dysfunction of lumbar region: Secondary | ICD-10-CM | POA: Diagnosis not present

## 2023-06-12 DIAGNOSIS — M9901 Segmental and somatic dysfunction of cervical region: Secondary | ICD-10-CM | POA: Diagnosis not present

## 2023-06-13 ENCOUNTER — Other Ambulatory Visit: Payer: Self-pay | Admitting: Neurology

## 2023-06-13 DIAGNOSIS — Z0000 Encounter for general adult medical examination without abnormal findings: Secondary | ICD-10-CM | POA: Diagnosis not present

## 2023-06-13 DIAGNOSIS — G20A1 Parkinson's disease without dyskinesia, without mention of fluctuations: Secondary | ICD-10-CM

## 2023-07-02 ENCOUNTER — Other Ambulatory Visit: Payer: Self-pay | Admitting: Family Medicine

## 2023-07-02 DIAGNOSIS — Z0000 Encounter for general adult medical examination without abnormal findings: Secondary | ICD-10-CM

## 2023-07-05 DIAGNOSIS — M9902 Segmental and somatic dysfunction of thoracic region: Secondary | ICD-10-CM | POA: Diagnosis not present

## 2023-07-05 DIAGNOSIS — M9903 Segmental and somatic dysfunction of lumbar region: Secondary | ICD-10-CM | POA: Diagnosis not present

## 2023-07-05 DIAGNOSIS — M9901 Segmental and somatic dysfunction of cervical region: Secondary | ICD-10-CM | POA: Diagnosis not present

## 2023-07-05 DIAGNOSIS — M9905 Segmental and somatic dysfunction of pelvic region: Secondary | ICD-10-CM | POA: Diagnosis not present

## 2023-07-13 ENCOUNTER — Ambulatory Visit
Admission: RE | Admit: 2023-07-13 | Discharge: 2023-07-13 | Disposition: A | Discharge status: Home / Self Care | Payer: BC Managed Care – PPO | Source: Ambulatory Visit | Attending: Family Medicine | Admitting: Family Medicine

## 2023-07-13 DIAGNOSIS — Z0000 Encounter for general adult medical examination without abnormal findings: Secondary | ICD-10-CM

## 2023-09-27 ENCOUNTER — Other Ambulatory Visit: Payer: Self-pay

## 2023-09-27 ENCOUNTER — Encounter: Payer: Self-pay | Admitting: Neurology

## 2023-09-27 DIAGNOSIS — G20A1 Parkinson's disease without dyskinesia, without mention of fluctuations: Secondary | ICD-10-CM

## 2023-09-27 MED ORDER — CARBIDOPA-LEVODOPA 25-100 MG PO TABS: ORAL_TABLET | ORAL | 0 refills | Status: DC

## 2023-11-22 NOTE — Progress Notes (Signed)
    Assessment/Plan:   1.  Parkinsons Disease  -Has had abnormal DaTscan  in the past.  - Carbidopa /levodopa  25/100, 1 tablet 3 times per day.  -discussed vyalev and onapgo but she doesn't need it.  -We discussed that it used to be thought that levodopa  would increase risk of melanoma but now it is believed that Parkinsons itself likely increases risk of melanoma. she is to get regular skin checks and goes yearly  -did PF GENEration study and was neg for known genetics  2.  Bilateral retinal hemorrhages  -Patient has seen [facility] and follows with optometry Subjective:   Elizabeth Woodard was seen today in follow up for Parkinsons disease.  My previous records were reviewed prior to todays visit as well as outside records available to me.  Patient continues to do well with Parkinson's disease.  She continues to exercise faithfully.  She continues to take her levodopa  3 times per day.  She is following regularly with her primary care physician, last being seen January 29.  Notes that are available are reviewed (they are limited, as she is seen at [facility] and I can only see a little bit).  Current prescribed movement disorder medications: Carbidopa /levodopa  25/100, 1 tablet 3 times per day  PREVIOUS MEDICATIONS: Sinemet ; mirtazapine  (did well on it, but just did not need it any longer)  ALLERGIES:  No Known Allergies  CURRENT MEDICATIONS:  Outpatient Encounter Medications as of 11/26/2023  Medication Sig   carbidopa -levodopa  (SINEMET  IR) 25-100 MG tablet TAKE 1 TABLET BY MOUTH THREE TIMES DAILY AT 7 AM AND AT 11 AM AND AT 4 PM   cholestyramine (QUESTRAN) 4 g packet Take 4 g by mouth 2 (two) times daily.   testosterone  (ANDROGEL ) 50 MG/5GM (1%) GEL Place 5 g onto the skin daily.   No facility-administered encounter medications on file as of 11/26/2023.    Objective:   PHYSICAL EXAMINATION:    VITALS:   Vitals:   11/26/23 0828  BP: 124/76  Pulse: 76  SpO2: 96%  Weight: 133  lb (60.3 kg)  Height: 4' 11 (1.499 m)    GEN:  The patient appears stated age and is in NAD. HEENT:  Normocephalic, atraumatic.  The mucous membranes are moist. The superficial temporal arteries are without ropiness or tenderness. CV:  RRR Lungs:  CTAB  Neurological examination:  Orientation: The patient is alert and oriented x3. Cranial nerves: There is good facial symmetry without facial hypomimia. The speech is fluent and clear. Hearing is intact to conversational tone. Sensation: Sensation is intact to light touch throughout Motor: Strength is at least antigravity x4.  Movement examination: Tone: There is nl tone in the UE/LE Abnormal movements: rare tremor on the R Coordination:  There is no decremation with RAM's, with any form of RAMS, including alternating supination and pronation of the forearm, hand opening and closing, finger taps, heel taps and toe taps bilaterally. Gait and Station: The patient has no difficulty arising out of a deep-seated chair without the use of the hands. The patient's stride length is good/nl.      Cc:  Kip Righter, MD

## 2023-11-26 ENCOUNTER — Encounter: Payer: Self-pay | Admitting: Neurology

## 2023-11-26 ENCOUNTER — Ambulatory Visit (INDEPENDENT_AMBULATORY_CARE_PROVIDER_SITE_OTHER): Payer: BC Managed Care – PPO | Admitting: Neurology

## 2023-11-26 VITALS — BP 124/76 | HR 76 | Ht 59.0 in | Wt 133.0 lb

## 2023-11-26 DIAGNOSIS — G20A1 Parkinson's disease without dyskinesia, without mention of fluctuations: Secondary | ICD-10-CM | POA: Diagnosis not present

## 2023-11-26 DIAGNOSIS — R61 Generalized hyperhidrosis: Secondary | ICD-10-CM | POA: Insufficient documentation

## 2023-11-26 MED ORDER — CARBIDOPA-LEVODOPA 25-100 MG PO TABS: ORAL_TABLET | ORAL | 1 refills | Status: DC

## 2023-11-26 NOTE — Patient Instructions (Signed)

## 2024-06-01 NOTE — Progress Notes (Unsigned)
" ° ° °  Assessment/Plan:   1.  Parkinsons Disease  -Has had abnormal DaTscan  in the past.  - Carbidopa /levodopa  25/100, 1 tablet 3 times per day.  -discussed vyalev and onapgo but she doesn't need it.  -We discussed that it used to be thought that levodopa  would increase risk of melanoma but now it is believed that Parkinsons itself likely increases risk of melanoma. she is to get regular skin checks and goes yearly  -did PF GENEration study and was neg for known genetics  2.  Bilateral retinal hemorrhages  -Patient has seen [facility] and follows with optometry Subjective:   Elizabeth Woodard was seen today in follow up for Parkinsons disease.  My previous records were reviewed prior to todays visit as well as outside records available to me.  Patient remains stable with Parkinson's.  She is on levodopa  3 times per day.  She has had no falls.  She continues to exercise faithfully.  She is working full-time and is able to do her job without Parkinsons interfering.  No lightheadedness or near syncope.  Current prescribed movement disorder medications: Carbidopa /levodopa  25/100, 1 tablet 3 times per day  PREVIOUS MEDICATIONS: Sinemet ; mirtazapine  (did well on it, but just did not need it any longer)  ALLERGIES:  No Known Allergies  CURRENT MEDICATIONS:  Outpatient Encounter Medications as of 06/03/2024  Medication Sig   carbidopa -levodopa  (SINEMET  IR) 25-100 MG tablet TAKE 1 TABLET BY MOUTH THREE TIMES DAILY AT 7 AM AND AT 11 AM AND AT 4 PM   cholestyramine (QUESTRAN) 4 g packet Take 4 g by mouth 2 (two) times daily.   testosterone  (ANDROGEL ) 50 MG/5GM (1%) GEL Place 5 g onto the skin daily.   No facility-administered encounter medications on file as of 06/03/2024.    Objective:   PHYSICAL EXAMINATION:    VITALS:   There were no vitals filed for this visit.   GEN:  The patient appears stated age and is in NAD. HEENT:  Normocephalic, atraumatic.  The mucous membranes are moist.  The superficial temporal arteries are without ropiness or tenderness. CV:  RRR Lungs:  CTAB  Neurological examination:  Orientation: The patient is alert and oriented x3. Cranial nerves: There is good facial symmetry without facial hypomimia. The speech is fluent and clear. Hearing is intact to conversational tone. Sensation: Sensation is intact to light touch throughout Motor: Strength is at least antigravity x4.  Movement examination: Tone: There is nl tone in the UE/LE Abnormal movements: rare tremor on the R Coordination:  There is no decremation with RAM's, with any form of RAMS, including alternating supination and pronation of the forearm, hand opening and closing, finger taps, heel taps and toe taps bilaterally. Gait and Station: The patient has no difficulty arising out of a deep-seated chair without the use of the hands. The patient's stride length is good/nl.    Total time spent on today's visit was *** minutes, including both face-to-face time and nonface-to-face time.  Time included that spent on review of records (prior notes available to me/labs/imaging if pertinent), discussing treatment and goals, answering patient's questions and coordinating care.   Cc:  Kip Righter, MD "

## 2024-06-03 ENCOUNTER — Ambulatory Visit (INDEPENDENT_AMBULATORY_CARE_PROVIDER_SITE_OTHER): Payer: Self-pay | Admitting: Neurology

## 2024-06-03 VITALS — BP 124/86 | HR 61 | Wt 136.2 lb

## 2024-06-03 DIAGNOSIS — G20A1 Parkinson's disease without dyskinesia, without mention of fluctuations: Secondary | ICD-10-CM

## 2024-06-03 MED ORDER — CARBIDOPA-LEVODOPA 25-100 MG PO TABS: ORAL_TABLET | ORAL | 1 refills | Status: AC

## 2024-12-02 ENCOUNTER — Ambulatory Visit: Payer: Self-pay | Admitting: Neurology
